# Patient Record
Sex: Male | Born: 1980 | Race: Black or African American | Hispanic: No | Marital: Single | Smoking: Former smoker
Health system: Southern US, Community
[De-identification: ages and names within clinical notes are randomized; demographics above are authoritative.]

## PROBLEM LIST (undated history)

## (undated) DIAGNOSIS — IMO0002 Reserved for concepts with insufficient information to code with codable children: Secondary | ICD-10-CM

## (undated) DIAGNOSIS — I10 Essential (primary) hypertension: Secondary | ICD-10-CM

## (undated) DIAGNOSIS — K219 Gastro-esophageal reflux disease without esophagitis: Secondary | ICD-10-CM

## (undated) HISTORY — PX: TONSILLECTOMY: SUR1361

---

## 2013-09-15 ENCOUNTER — Encounter (HOSPITAL_COMMUNITY): Payer: Self-pay | Admitting: Emergency Medicine

## 2013-09-15 DIAGNOSIS — I10 Essential (primary) hypertension: Secondary | ICD-10-CM | POA: Insufficient documentation

## 2013-09-15 DIAGNOSIS — Z87891 Personal history of nicotine dependence: Secondary | ICD-10-CM | POA: Insufficient documentation

## 2013-09-15 DIAGNOSIS — Z880 Allergy status to penicillin: Secondary | ICD-10-CM | POA: Insufficient documentation

## 2013-09-15 DIAGNOSIS — R369 Urethral discharge, unspecified: Secondary | ICD-10-CM | POA: Insufficient documentation

## 2013-09-15 DIAGNOSIS — N2889 Other specified disorders of kidney and ureter: Secondary | ICD-10-CM | POA: Insufficient documentation

## 2013-09-15 NOTE — ED Notes (Addendum)
Pt reports "entire head" HA x 2 years that is progressively worsening. Reports intermittent dizziness. Pt ambulatory to triage. Denies blurred vision/double vision. PERRLA. Neuro intact. PT also would like STD check.

## 2013-09-16 ENCOUNTER — Emergency Department (HOSPITAL_COMMUNITY)
Admission: EM | Admit: 2013-09-16 | Discharge: 2013-09-16 | Disposition: A | Discharge status: Home / Self Care | Payer: Medicaid - Out of State | Attending: Emergency Medicine | Admitting: Emergency Medicine

## 2013-09-16 DIAGNOSIS — R51 Headache: Secondary | ICD-10-CM

## 2013-09-16 DIAGNOSIS — N342 Other urethritis: Secondary | ICD-10-CM

## 2013-09-16 DIAGNOSIS — R519 Headache, unspecified: Secondary | ICD-10-CM

## 2013-09-16 HISTORY — DX: Essential (primary) hypertension: I10

## 2013-09-16 MED ORDER — CEFTRIAXONE SODIUM 250 MG IJ SOLR
250.0000 mg | Freq: Once | INTRAMUSCULAR | Status: AC
  Administered 2013-09-16: 250 mg via INTRAMUSCULAR
  Filled 2013-09-16: qty 250

## 2013-09-16 MED ORDER — IBUPROFEN 400 MG PO TABS
600.0000 mg | ORAL_TABLET | Freq: Once | ORAL | Status: DC
  Filled 2013-09-16 (×2): qty 1

## 2013-09-16 MED ORDER — IBUPROFEN 600 MG PO TABS: 600.0000 mg | ORAL_TABLET | Freq: Four times a day (QID) | ORAL | Status: DC | PRN

## 2013-09-16 MED ORDER — LIDOCAINE HCL (PF) 1 % IJ SOLN
INTRAMUSCULAR | Status: AC
  Administered 2013-09-16: 2.1 mL
  Filled 2013-09-16: qty 5

## 2013-09-16 MED ORDER — DOXYCYCLINE HYCLATE 100 MG PO CAPS: 100.0000 mg | ORAL_CAPSULE | Freq: Two times a day (BID) | ORAL | Status: DC

## 2013-09-16 NOTE — ED Provider Notes (Signed)
CSN: [id number]     Arrival date & time 09/15/13  2046 History   First MD Initiated Contact with Patient 09/16/13 0102     Chief Complaint  Patient presents with  . Headache     (Consider location/radiation/quality/duration/timing/severity/associated sxs/prior Treatment) HPI Patient presents with 2 years of bandlike headache associated with muscle tension in the upper part of the neck. He has no focal weakness or numbness with this. He has no photophobia or visual disturbance with this. He states there are multiple things that set off these headaches. Since moving into the area he has yet to see a neurologist about these headaches. He does state he has a CT head in the past which was normal. He is asking for an MRI. These headaches are unchanged. He has no fevers or chills. He has no neck stiffness. He has no nausea or vomiting.  Patient also is asking treatment for penile discharge and suspected STD. He's had this for several days after having unprotected sex with a male. He denies any pain associated with this. He denies any testicular pain. Past Medical History  Diagnosis Date  . Hypertension    Past Surgical History  Procedure Laterality Date  . Tonsillectomy     No family history on file. History  Substance Use Topics  . Smoking status: Former Smoker    Quit date: 09/16/2011  . Smokeless tobacco: Not on file  . Alcohol Use: No    Review of Systems  Constitutional: Negative for fever and chills.  Eyes: Negative for photophobia and visual disturbance.  Respiratory: Negative for shortness of breath.   Cardiovascular: Negative for chest pain.  Gastrointestinal: Negative for nausea, vomiting, abdominal pain and diarrhea.  Genitourinary: Positive for discharge. Negative for dysuria, flank pain, penile swelling, scrotal swelling, penile pain and testicular pain.  Musculoskeletal: Negative for back pain, neck pain and neck stiffness.  Skin: Negative for rash and wound.   Neurological: Positive for headaches. Negative for dizziness, syncope, weakness and numbness.  All other systems reviewed and are negative.     Allergies  Penicillins  Home Medications   Prior to Admission medications   Medication Sig Start Date End Date Taking? Authorizing Provider  doxycycline (VIBRAMYCIN) 100 MG capsule Take 1 capsule (100 mg total) by mouth 2 (two) times daily. One po bid x 7 days 09/16/13   Loren Raceravid Emilo Gras, MD  ibuprofen (ADVIL,MOTRIN) 600 MG tablet Take 1 tablet (600 mg total) by mouth every 6 (six) hours as needed. 09/16/13   Loren Raceravid Vasiliy Mccarry, MD   BP 131/83  Pulse 77  Temp(Src) 97.9 F (36.6 C) (Oral)  Resp 19  Ht 6' (1.829 m)  SpO2 100% Physical Exam  Nursing note and vitals reviewed. Constitutional: He is oriented to person, place, and time. He appears well-developed and well-nourished. No distress.  HENT:  Head: Normocephalic and atraumatic.  Mouth/Throat: Oropharynx is clear and moist. No oropharyngeal exudate.  No sinus tenderness to percussion. Patient does have mild tenderness with palpation over the temporalis and occipitalis muscles  Eyes: EOM are normal. Pupils are equal, round, and reactive to light.  Neck: Normal range of motion. Neck supple.  No nuchal rigidity or posterior midline cervical tenderness.  Cardiovascular: Normal rate and regular rhythm.  Exam reveals no gallop and no friction rub.   No murmur heard. Pulmonary/Chest: Effort normal and breath sounds normal. No respiratory distress. He has no wheezes. He has no rales. He exhibits no tenderness.  Abdominal: Soft. Bowel sounds are normal. He exhibits  no distension and no mass. There is no tenderness. There is no rebound and no guarding.  Genitourinary:  Purulent penile discharge. No testicular masses or pain.  Musculoskeletal: Normal range of motion. He exhibits no edema and no tenderness.  Neurological: He is alert and oriented to person, place, and time.  Patient is alert and  oriented x3 with clear, goal oriented speech. Patient has 5/5 motor in all extremities. Sensation is intact to light touch. Bilateral finger-to-nose is normal with no signs of dysmetria. Patient has a normal gait and walks without assistance.   Skin: Skin is warm and dry. No rash noted. No erythema.  Psychiatric: He has a normal mood and affect. His behavior is normal.    ED Course  Procedures (including critical care time) Labs Review Labs Reviewed - No data to display  Imaging Review No results found.   EKG Interpretation None      MDM   Final diagnoses:  Urethritis  Chronic headaches    Patient treated for urethritis with IM injection and will be sent home with course of doxycycline. He's been advised to have all sexual partners treated. Regarding the patient's headache he is advised to followup with a neurologist. He's been provided resources. I do not believe that an emergent MRI imaging is necessary given the chronic nature of the headache. He has a normal neurologic exam. Return precautions have been given.   Loren Raceravid Keir Foland, MD 09/16/13 [phone]

## 2013-09-16 NOTE — Discharge Instructions (Signed)
Take medication as prescribed. Followup with a neurologist for persistent headaches. Have all sexual partners seen by a medical professional and treated. Use safe sex practices.  Tension Headache A tension headache is a feeling of pain, pressure, or aching often felt over the front and sides of the head. The pain can be dull or can feel tight (constricting). It is the most common type of headache. Tension headaches are not normally associated with nausea or vomiting and do not get worse with physical activity. Tension headaches can last 30 minutes to several days.  CAUSES  The exact cause is not known, but it may be caused by chemicals and hormones in the brain that lead to pain. Tension headaches often begin after stress, anxiety, or depression. Other triggers may include:  Alcohol.  Caffeine (too much or withdrawal).  Respiratory infections (colds, flu, sinus infections).  Dental problems or teeth clenching.  Fatigue.  Holding your head and neck in one position too long while using a computer. SYMPTOMS   Pressure around the head.   Dull, aching head pain.   Pain felt over the front and sides of the head.   Tenderness in the muscles of the head, neck, and shoulders. DIAGNOSIS  A tension headache is often diagnosed based on:   Symptoms.   Physical examination.   A CT scan or MRI of your head. These tests may be ordered if symptoms are severe or unusual. TREATMENT  Medicines may be given to help relieve symptoms.  HOME CARE INSTRUCTIONS   Only take over-the-counter or prescription medicines for pain or discomfort as directed by your caregiver.   Lie down in a dark, quiet room when you have a headache.   Keep a journal to find out what may be triggering your headaches. For example, write down:  What you eat and drink.  How much sleep you get.  Any change to your diet or medicines.  Try massage or other relaxation techniques.   Ice packs or heat applied to  the head and neck can be used. Use these 3 to 4 times per day for 15 to 20 minutes each time, or as needed.   Limit stress.   Sit up straight, and do not tense your muscles.   Quit smoking if you smoke.  Limit alcohol use.  Decrease the amount of caffeine you drink, or stop drinking caffeine.  Eat and exercise regularly.  Get 7 to 9 hours of sleep, or as recommended by your caregiver.  Avoid excessive use of pain medicine as recurrent headaches can occur.  SEEK MEDICAL CARE IF:   You have problems with the medicines you were prescribed.  Your medicines do not work.  You have a change from the usual headache.  You have nausea or vomiting. SEEK IMMEDIATE MEDICAL CARE IF:   Your headache becomes severe.  You have a fever.  You have a stiff neck.  You have loss of vision.  You have muscular weakness or loss of muscle control.  You lose your balance or have trouble walking.  You feel faint or pass out.  You have severe symptoms that are different from your first symptoms. MAKE SURE YOU:   Understand these instructions.  Will watch your condition.  Will get help right away if you are not doing well or get worse. Document Released: 05/08/2005 Document Revised: 07/31/2011 Document Reviewed: 04/28/2011 [employer] Patient Information 2014 [place], [place]. Sexually Transmitted Disease A sexually transmitted disease (STD) is a disease or infection that may  be passed (transmitted) from person to person, usually during sexual activity. This may happen by way of saliva, semen, blood, vaginal mucus, or urine. Common STDs include:   Gonorrhea.   Chlamydia.   Syphilis.   HIV and AIDS.   Genital herpes.   Hepatitis B and C.   Trichomonas.   Human papillomavirus (HPV).   Pubic lice.   Scabies.  Mites.  Bacterial vaginosis. WHAT ARE CAUSES OF STDs? An STD may be caused by bacteria, a virus, or parasites. STDs are often transmitted during sexual  activity if one person is infected. However, they may also be transmitted through nonsexual means. STDs may be transmitted after:   Sexual intercourse with an infected person.   Sharing sex toys with an infected person.   Sharing needles with an infected person or using unclean piercing or tattoo needles.  Having intimate contact with the genitals, mouth, or rectal areas of an infected person.   Exposure to infected fluids during birth. WHAT ARE THE SIGNS AND SYMPTOMS OF STDs? Different STDs have different symptoms. Some people may not have any symptoms. If symptoms are present, they may include:   Painful or bloody urination.   Pain in the pelvis, abdomen, vagina, anus, throat, or eyes.   Skin rash, itching, irritation, growths, sores (lesions), ulcerations, or warts in the genital or anal area.  Abnormal vaginal discharge with or without bad odor.   Penile discharge in men.   Fever.   Pain or bleeding during sexual intercourse.   Swollen glands in the groin area.   Yellow skin and eyes (jaundice). This is seen with hepatitis.   Swollen testicles.  Infertility.  Sores and blisters in the mouth. HOW ARE STDs DIAGNOSED? To make a diagnosis, your health care provider may:   Take a medical history.   Perform a physical exam.   Take a sample of any discharge for examination.  Swab the throat, cervix, opening to the penis, rectum, or vagina for testing.  Test a sample of your first morning urine.   Perform blood tests.   Perform a Pap smear, if this applies.   Perform a colposcopy.   Perform a laparoscopy.  HOW ARE STDs TREATED? Treatment depends on the STD. Some STDs may be treated but not cured.   Chlamydia, gonorrhea, trichomonas, and syphilis can be cured with antibiotics.   Genital herpes, hepatitis, and HIV can be treated, but not cured, with prescribed medicines. The medicines lessen symptoms.   Genital warts from HPV can be  treated with medicine or by freezing, burning (electrocautery), or surgery. Warts may come back.   HPV cannot be cured with medicine or surgery. However, abnormal areas may be removed from the cervix, vagina, or vulva.   If your diagnosis is confirmed, your recent sexual partners need treatment. This is true even if they are symptom-free or have a negative culture or evaluation. They should not have sex until their health care providers say it is OK. HOW CAN I REDUCE MY RISK OF GETTING AN STD?  Use latex condoms, dental dams, and water-soluble lubricants during sexual activity. Do not use petroleum jelly or oils.  Get vaccinated for HPV and hepatitis. If you have not received these vaccines in the past, talk to your health care provider about whether one or both might be right for you.   Avoid risky sex practices that can break the skin.  WHAT SHOULD I DO IF I THINK I HAVE AN STD?  See your health care  provider.   Inform all sexual partners. They should be tested and treated for any STDs.  Do not have sex until your health care provider says it is OK. WHEN SHOULD I GET HELP? Seek immediate medical care if:  You develop severe abdominal pain.  You are a man and notice swelling or pain in the testicles.  You are a woman and notice swelling or pain in your vagina. Document Released: 07/29/2002 Document Revised: 02/26/2013 Document Reviewed: 11/26/2012 [facility] Patient Information 2014 [place]Care, [place]LLC.

## 2013-10-16 ENCOUNTER — Encounter: Payer: Self-pay | Admitting: *Deleted

## 2013-10-21 ENCOUNTER — Ambulatory Visit: Payer: PRIVATE HEALTH INSURANCE | Admitting: Neurology

## 2013-10-22 ENCOUNTER — Encounter: Payer: Self-pay | Admitting: Neurology

## 2013-10-22 ENCOUNTER — Telehealth: Payer: Self-pay | Admitting: Neurology

## 2013-10-22 NOTE — Telephone Encounter (Signed)
Pt no showed NP appt w/ Dr. Shon Millet. Pt was ER referral. No show letter mailed to patient / Oneita Kras.

## 2014-03-02 ENCOUNTER — Emergency Department (HOSPITAL_COMMUNITY)
Admission: EM | Admit: 2014-03-02 | Discharge: 2014-03-02 | Disposition: A | Discharge status: Home / Self Care | Payer: PRIVATE HEALTH INSURANCE | Attending: Emergency Medicine | Admitting: Emergency Medicine

## 2014-03-02 ENCOUNTER — Encounter (HOSPITAL_COMMUNITY): Payer: Self-pay | Admitting: Emergency Medicine

## 2014-03-02 DIAGNOSIS — I10 Essential (primary) hypertension: Secondary | ICD-10-CM | POA: Insufficient documentation

## 2014-03-02 DIAGNOSIS — Z87891 Personal history of nicotine dependence: Secondary | ICD-10-CM | POA: Insufficient documentation

## 2014-03-02 DIAGNOSIS — Z880 Allergy status to penicillin: Secondary | ICD-10-CM | POA: Insufficient documentation

## 2014-03-02 DIAGNOSIS — Z202 Contact with and (suspected) exposure to infections with a predominantly sexual mode of transmission: Secondary | ICD-10-CM

## 2014-03-02 LAB — RPR

## 2014-03-02 LAB — HIV ANTIBODY (ROUTINE TESTING W REFLEX): HIV 1&2 Ab, 4th Generation: NONREACTIVE

## 2014-03-02 NOTE — Discharge Instructions (Signed)
You'll be contacted if any of your culture reports her blood testing is positive. Return here if you develop penile drainage or discharge.

## 2014-03-02 NOTE — ED Provider Notes (Signed)
CSN: [id number]     Arrival date & time 03/02/14  0017 History   First MD Initiated Contact with Patient 03/02/14 0051     Chief Complaint  Patient presents with  . SEXUALLY TRANSMITTED DISEASE     (Consider location/radiation/quality/duration/timing/severity/associated sxs/prior Treatment) HPI Comments: Patient here complaining of possible exposure to STD. Girlfriend told him that here to be evaluated. She did not elaborate if you had an STD. He denies any symptoms at this time. No penile drainage or discharge. Denies any genital rashes. No fever or chills.  The history is provided by the patient.    Past Medical History  Diagnosis Date  . Hypertension    Past Surgical History  Procedure Laterality Date  . Tonsillectomy     No family history on file. History  Substance Use Topics  . Smoking status: Former Smoker    Quit date: 09/16/2011  . Smokeless tobacco: Not on file  . Alcohol Use: No    Review of Systems  All other systems reviewed and are negative.     Allergies  Penicillins  Home Medications   Prior to Admission medications   Not on File   BP 155/86  Pulse 59  Temp(Src) 98.9 F (37.2 C) (Oral)  Resp 20  Wt 244 lb (110.678 kg)  SpO2 98% Physical Exam  Nursing note and vitals reviewed. Constitutional: He is oriented to person, place, and time. He appears well-developed and well-nourished.  Non-toxic appearance. No distress.  HENT:  Head: Normocephalic and atraumatic.  Eyes: Conjunctivae, EOM and lids are normal. Pupils are equal, round, and reactive to light.  Neck: Normal range of motion. Neck supple. No tracheal deviation present. No mass present.  Cardiovascular: Normal rate, regular rhythm and normal heart sounds.  Exam reveals no gallop.   No murmur heard. Pulmonary/Chest: Effort normal and breath sounds normal. No stridor. No respiratory distress. He has no decreased breath sounds. He has no wheezes. He has no rhonchi. He has no rales.   Abdominal: Soft. Normal appearance and bowel sounds are normal. He exhibits no distension. There is no tenderness. There is no rebound and no CVA tenderness.  Musculoskeletal: Normal range of motion. He exhibits no edema and no tenderness.  Neurological: He is alert and oriented to person, place, and time. He has normal strength. No cranial nerve deficit or sensory deficit. GCS eye subscore is 4. GCS verbal subscore is 5. GCS motor subscore is 6.  Skin: Skin is warm and dry. No abrasion and no rash noted.  Psychiatric: He has a normal mood and affect. His speech is normal and behavior is normal.    ED Course  Procedures (including critical care time) Labs Review Labs Reviewed  GC/CHLAMYDIA PROBE AMP  HIV ANTIBODY (ROUTINE TESTING)  RPR    Imaging Review No results found.   EKG Interpretation None      MDM   Final diagnoses:  None    Genital culture obtained and RPR and HIV testing done. Patient will be contacted results are positive    Toy BakerAnthony T Charnita Trudel, MD 03/02/14 0104
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
## 2014-03-02 NOTE — ED Notes (Signed)
Patient is alert and oriented x3.  He was given DC instructions and follow up visit instructions.  Patient gave verbal understanding.  He was DC ambulatory under his own power to home.  V/S stable.  He was not showing any signs of distress on DC 

## 2014-03-03 LAB — GC/CHLAMYDIA PROBE AMP
CT Probe RNA: NEGATIVE
GC Probe RNA: NEGATIVE

## 2016-02-28 ENCOUNTER — Emergency Department (HOSPITAL_COMMUNITY)
Admission: EM | Admit: 2016-02-28 | Discharge: 2016-02-28 | Disposition: A | Discharge status: Home / Self Care | Payer: No Typology Code available for payment source | Attending: Emergency Medicine | Admitting: Emergency Medicine

## 2016-02-28 ENCOUNTER — Encounter (HOSPITAL_COMMUNITY): Payer: Self-pay | Admitting: Emergency Medicine

## 2016-02-28 DIAGNOSIS — Y939 Activity, unspecified: Secondary | ICD-10-CM | POA: Diagnosis not present

## 2016-02-28 DIAGNOSIS — Y92410 Unspecified street and highway as the place of occurrence of the external cause: Secondary | ICD-10-CM | POA: Insufficient documentation

## 2016-02-28 DIAGNOSIS — Y999 Unspecified external cause status: Secondary | ICD-10-CM | POA: Diagnosis not present

## 2016-02-28 DIAGNOSIS — Z87891 Personal history of nicotine dependence: Secondary | ICD-10-CM | POA: Insufficient documentation

## 2016-02-28 DIAGNOSIS — I10 Essential (primary) hypertension: Secondary | ICD-10-CM | POA: Insufficient documentation

## 2016-02-28 DIAGNOSIS — S199XXA Unspecified injury of neck, initial encounter: Secondary | ICD-10-CM | POA: Diagnosis present

## 2016-02-28 DIAGNOSIS — S161XXA Strain of muscle, fascia and tendon at neck level, initial encounter: Secondary | ICD-10-CM

## 2016-02-28 HISTORY — DX: Reserved for concepts with insufficient information to code with codable children: IMO0002

## 2016-02-28 HISTORY — DX: Gastro-esophageal reflux disease without esophagitis: K21.9

## 2016-02-28 MED ORDER — NAPROXEN 500 MG PO TABS: 500.0000 mg | ORAL_TABLET | Freq: Two times a day (BID) | ORAL | 0 refills | Status: DC

## 2016-02-28 NOTE — Discharge Instructions (Signed)
Apply ice to affected area. Take naprosyn as needed for pain. Increase your mobility. Follow up with your primary care provider if your symptoms do not improve. Return to the ED if you experience severe worsening of your symptoms, loss of consciousness, blurry vision, vomiting, loss of control of your bowels or bladder, inability to walk, numbness or tingling in your lower extremities.
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
## 2016-04-04 ENCOUNTER — Encounter (HOSPITAL_COMMUNITY): Payer: Self-pay | Admitting: Emergency Medicine

## 2016-04-04 ENCOUNTER — Emergency Department (HOSPITAL_COMMUNITY)
Admission: EM | Admit: 2016-04-04 | Discharge: 2016-04-04 | Disposition: A | Discharge status: Home / Self Care | Payer: PRIVATE HEALTH INSURANCE | Attending: Emergency Medicine | Admitting: Emergency Medicine

## 2016-04-04 DIAGNOSIS — R369 Urethral discharge, unspecified: Secondary | ICD-10-CM

## 2016-04-04 DIAGNOSIS — I10 Essential (primary) hypertension: Secondary | ICD-10-CM | POA: Insufficient documentation

## 2016-04-04 DIAGNOSIS — N342 Other urethritis: Secondary | ICD-10-CM | POA: Insufficient documentation

## 2016-04-04 DIAGNOSIS — Z87891 Personal history of nicotine dependence: Secondary | ICD-10-CM | POA: Insufficient documentation

## 2016-04-04 LAB — URINE MICROSCOPIC-ADD ON: RBC / HPF: NONE SEEN RBC/hpf (ref 0–5)

## 2016-04-04 LAB — URINALYSIS, ROUTINE W REFLEX MICROSCOPIC
BILIRUBIN URINE: NEGATIVE
Glucose, UA: NEGATIVE mg/dL
Hgb urine dipstick: NEGATIVE
Ketones, ur: NEGATIVE mg/dL
Nitrite: NEGATIVE
PH: 7.5 (ref 5.0–8.0)
Protein, ur: NEGATIVE mg/dL
SPECIFIC GRAVITY, URINE: 1.026 (ref 1.005–1.030)

## 2016-04-04 LAB — GC/CHLAMYDIA PROBE AMP (~~LOC~~) NOT AT ARMC
Chlamydia: NEGATIVE
NEISSERIA GONORRHEA: POSITIVE — AB

## 2016-04-04 MED ORDER — METRONIDAZOLE 500 MG PO TABS
2000.0000 mg | ORAL_TABLET | Freq: Once | ORAL | Status: AC
  Administered 2016-04-04: 2000 mg via ORAL
  Filled 2016-04-04: qty 4

## 2016-04-04 MED ORDER — AZITHROMYCIN 250 MG PO TABS
1000.0000 mg | ORAL_TABLET | Freq: Once | ORAL | Status: AC
  Administered 2016-04-04: 1000 mg via ORAL
  Filled 2016-04-04: qty 4

## 2016-04-04 MED ORDER — LIDOCAINE HCL (PF) 1 % IJ SOLN
INTRAMUSCULAR | Status: AC
  Administered 2016-04-04: 0.9 mL
  Filled 2016-04-04: qty 5

## 2016-04-04 MED ORDER — CEFTRIAXONE SODIUM 250 MG IJ SOLR
250.0000 mg | Freq: Once | INTRAMUSCULAR | Status: AC
  Administered 2016-04-04: 250 mg via INTRAMUSCULAR
  Filled 2016-04-04: qty 250

## 2016-04-04 NOTE — ED Notes (Signed)
Pt departed in NAD.  

## 2016-04-04 NOTE — ED Provider Notes (Signed)
[facility]-EMERGENCY DEPT Provider Note   CSN: [id number] Arrival date & time: 04/04/16  0209  By signing my name below, I, Arianna Nassar, attest that this documentation has been prepared under the direction and in the presence of Gilda Creasehristopher J Andreana Klingerman, MD.  Electronically Signed: Octavia HeirArianna Nassar, ED Scribe. 04/04/16. 3:32 AM.    History   Chief Complaint Chief Complaint  Patient presents with  . Penile Discharge    The history is provided by the patient. No language interpreter was used.   HPI Comments: Eric Kent is a 35 y.o. male who has a PMhx of HTN presents to the Emergency Department complaining of sudden onset, moderate, yellow penile discharge x 2 days. He notes associated dysuria. Pt has been having unprotected sex with the same partner for ~ 5 months. Pt believes his partner may have a yeast infection. There are no other complaints. Denies penile swelling or penile lesions.   Past Medical History:  Diagnosis Date  . GERD (gastroesophageal reflux disease)   . Hypertension   . Ulcer (HCC)     There are no active problems to display for this patient.   Past Surgical History:  Procedure Laterality Date  . TONSILLECTOMY         Home Medications    Prior to Admission medications   Medication Sig Start Date End Date Taking? Authorizing Provider  naproxen (NAPROSYN) 500 MG tablet Take 1 tablet (500 mg total) by mouth 2 (two) times daily. 02/28/16   Samantha Tripp Dowless, PA-C    Family History No family history on file.  Social History Social History  Substance Use Topics  . Smoking status: Former Smoker    Quit date: 09/16/2011  . Smokeless tobacco: Never Used  . Alcohol use No     Allergies   Penicillins   Review of Systems Review of Systems  Genitourinary: Positive for discharge and dysuria. Negative for penile pain and penile swelling.  All other systems reviewed and are negative.    Physical Exam Updated Vital Signs BP 133/86 (BP  Location: Left Arm)   Pulse 93   Temp 98.5 F (36.9 C) (Oral)   Resp 16   Ht 6' (1.829 m)   Wt 254 lb (115.2 kg)   SpO2 99%   BMI 34.45 kg/m   Physical Exam  Constitutional: He is oriented to person, place, and time. He appears well-developed and well-nourished. No distress.  HENT:  Head: Normocephalic and atraumatic.  Right Ear: Hearing normal.  Left Ear: Hearing normal.  Nose: Nose normal.  Mouth/Throat: Oropharynx is clear and moist and mucous membranes are normal.  Eyes: Conjunctivae and EOM are normal. Pupils are equal, round, and reactive to light.  Neck: Normal range of motion. Neck supple.  Cardiovascular: Regular rhythm, S1 normal and S2 normal.  Exam reveals no gallop and no friction rub.   No murmur heard. Pulmonary/Chest: Effort normal and breath sounds normal. No respiratory distress. He exhibits no tenderness.  Abdominal: Soft. Normal appearance and bowel sounds are normal. There is no hepatosplenomegaly. There is no tenderness. There is no rebound, no guarding, no tenderness at McBurney's point and negative Murphy's sign. No hernia.  Genitourinary:  Genitourinary Comments: Circumcised, scrotum normal   Musculoskeletal: Normal range of motion.  Neurological: He is alert and oriented to person, place, and time. He has normal strength. No cranial nerve deficit or sensory deficit. Coordination normal. GCS eye subscore is 4. GCS verbal subscore is 5. GCS motor subscore is 6.  Skin: Skin is  warm, dry and intact. No rash noted. No cyanosis.  Psychiatric: He has a normal mood and affect. His speech is normal and behavior is normal. Thought content normal.  Nursing note and vitals reviewed.    ED Treatments / Results  DIAGNOSTIC STUDIES: Oxygen Saturation is 99% on RA, normal by my interpretation.  COORDINATION OF CARE:  3:29 AM Discussed treatment plan with pt at bedside and pt agreed to plan.  Labs (all labs ordered are listed, but only abnormal results are  displayed) Labs Reviewed  URINALYSIS, ROUTINE W REFLEX MICROSCOPIC (NOT AT [facility])  GC/CHLAMYDIA PROBE AMP ([facility]) NOT AT [facility]    EKG  EKG Interpretation None       Radiology No results found.  Procedures Procedures (including critical care time)  Medications Ordered in ED Medications  cefTRIAXone (ROCEPHIN) injection 250 mg (not administered)  azithromycin (ZITHROMAX) tablet 1,000 mg (not administered)  metroNIDAZOLE (FLAGYL) tablet 2,000 mg (not administered)     Initial Impression / Assessment and Plan / ED Course  I have reviewed the triage vital signs and the nursing notes.  Pertinent labs & imaging results that were available during my care of the patient were reviewed by me and considered in my medical decision making (see chart for details).  Clinical Course    Patient presents with complaints of urethral discharge and dysuria. Patient does admit to unprotected sex. Physical examination was unremarkable. GC, chlamydia, RPR, HIV pending. Treat empirically.  I personally performed the services described in this documentation, which was scribed in my presence. The recorded information has been reviewed and is accurate.  Final Clinical Impressions(s) / ED Diagnoses   Final diagnoses:  Penile discharge  Urethritis    New Prescriptions New Prescriptions   No medications on file     Gilda Creasehristopher J Lamees Gable, MD 04/04/16 [phone]

## 2016-04-04 NOTE — ED Triage Notes (Signed)
Pt. reports yellowish penile discharge with dysuria onset last week , denies fever or chills .

## 2016-04-04 NOTE — ED Notes (Addendum)
ED Provider at bedside. 

## 2016-04-05 ENCOUNTER — Encounter (HOSPITAL_COMMUNITY): Payer: Self-pay | Admitting: Emergency Medicine

## 2016-06-02 ENCOUNTER — Encounter (HOSPITAL_COMMUNITY): Payer: Self-pay | Admitting: Emergency Medicine

## 2016-06-02 ENCOUNTER — Emergency Department (HOSPITAL_COMMUNITY)
Admission: EM | Admit: 2016-06-02 | Discharge: 2016-06-02 | Disposition: A | Discharge status: Home / Self Care | Payer: PRIVATE HEALTH INSURANCE | Attending: Emergency Medicine | Admitting: Emergency Medicine

## 2016-06-02 DIAGNOSIS — J029 Acute pharyngitis, unspecified: Secondary | ICD-10-CM | POA: Insufficient documentation

## 2016-06-02 DIAGNOSIS — Z79899 Other long term (current) drug therapy: Secondary | ICD-10-CM | POA: Insufficient documentation

## 2016-06-02 DIAGNOSIS — B356 Tinea cruris: Secondary | ICD-10-CM | POA: Insufficient documentation

## 2016-06-02 DIAGNOSIS — Z87891 Personal history of nicotine dependence: Secondary | ICD-10-CM | POA: Insufficient documentation

## 2016-06-02 DIAGNOSIS — I10 Essential (primary) hypertension: Secondary | ICD-10-CM | POA: Insufficient documentation

## 2016-06-02 DIAGNOSIS — R05 Cough: Secondary | ICD-10-CM | POA: Insufficient documentation

## 2016-06-02 DIAGNOSIS — R6889 Other general symptoms and signs: Secondary | ICD-10-CM

## 2016-06-02 MED ORDER — MICONAZOLE NITRATE 2 % EX CREA: 1.0000 "application " | TOPICAL_CREAM | Freq: Two times a day (BID) | CUTANEOUS | 0 refills | Status: DC

## 2016-06-02 NOTE — ED Provider Notes (Signed)
Eric BurlyMichael Kent is a 36 y.o. male, with a history of HTN, presenting to the ED with URI symptoms, but also complains of genital itching.    Physical Exam  BP (!) 118/52 (BP Location: Right Arm)   Pulse 80   Temp 98.7 F (37.1 C) (Oral)   Resp 18   SpO2 99%   Physical Exam  Constitutional: He appears well-developed and well-nourished. No distress.  HENT:  Head: Normocephalic and atraumatic.  Eyes: Conjunctivae are normal.  Neck: Neck supple.  Cardiovascular: Normal rate and regular rhythm.   Pulmonary/Chest: Effort normal.  Genitourinary:  Genitourinary Comments: Evidence of tinea cruris infection to left scrotum and left inguinal crease. Penis, scrotum, and testicles without swelling, lesions, or tenderness. No penile discharge.  No inguinal hernia noted. Cremasteric reflex intact. Otherwise normal male genitalia. RN served as Biomedical engineerchaperone during the exam.  Neurological: He is alert.  Skin: Skin is warm and dry. He is not diaphoretic.  Psychiatric: He has a normal mood and affect. His behavior is normal.  Nursing note and vitals reviewed.   ED Course  Procedures  MDM   Received handoff report from Mathews RobinsonsJessica Mitchell, PA-C. Patient is to receive a genital exam and have appropriate treatment prescribed. She states he has been handling his URI symptoms well and can be instructed to maintain in his current course.  Tinea curious on exam. Appropriate treatment prescribed and discuss.   Vitals:   06/02/16 1835 06/02/16 2145  BP: (!) 118/52 131/88  Pulse: 80 63  Resp: 18 16  Temp: 98.7 F (37.1 C)   TempSrc: Oral   SpO2: 99% 96%        Anselm PancoastShawn C Sheilyn Boehlke, PA-C 06/03/16 0155    Derwood KaplanAnkit Nanavati, MD 06/05/16 1631

## 2016-06-02 NOTE — Discharge Instructions (Signed)
Continue with your previously prescribed treatment for your flu-like symptoms.   For the genital infection, jock itch is the likely cause. Use the prescribed cream twice daily. Continue apply the cream for 2 days after the rash resolves to avoid repeat infection.

## 2016-06-02 NOTE — ED Triage Notes (Signed)
Pt complaint of cough, sore throat, and chills for 4 days unrelieved by OTC medications.

## 2016-06-02 NOTE — ED Provider Notes (Signed)
[facility]-EMERGENCY DEPT Provider Note   CSN: [id number] Arrival date & time: 06/02/16  1822     History   Chief Complaint Chief Complaint  Patient presents with  . Flu Like Symptoms    HPI Eric Kent is a 36 y.o. male presenting with 4 days of runny nose, sore throat, cough, myalgias and fatigue. Patient states that he has tried DayQuil has been sleeping in the first couple of days and has been doing better overall. He is drinking and eating well. She denies chest pain, shortness breath, productive cough, nausea, vomiting, reports subjective fevers but no chills. He denies abdominal pain, diarrhea, dysuria, hematuria. He came to the emergency department because he wanted to get something that would stop the cold faster. He also states that he's been having a jock itch. He has tried antifungal creams over-the-counter and cortisone with mild relief.   HPI  Past Medical History:  Diagnosis Date  . GERD (gastroesophageal reflux disease)   . Hypertension   . Ulcer (HCC)     There are no active problems to display for this patient.   Past Surgical History:  Procedure Laterality Date  . TONSILLECTOMY         Home Medications    Prior to Admission medications   Medication Sig Start Date End Date Taking? Authorizing Provider  naproxen (NAPROSYN) 500 MG tablet Take 1 tablet (500 mg total) by mouth 2 (two) times daily. 02/28/16   Samantha Tripp Dowless, PA-C    Family History No family history on file.  Social History Social History  Substance Use Topics  . Smoking status: Former Smoker    Quit date: 09/16/2011  . Smokeless tobacco: Never Used  . Alcohol use No     Allergies   Penicillins   Review of Systems Review of Systems  Constitutional: Negative for chills and fever.  HENT: Positive for congestion, postnasal drip, rhinorrhea and sore throat. Negative for ear pain and trouble swallowing.   Eyes: Negative for pain and visual disturbance.  Respiratory:  Positive for cough. Negative for choking, chest tightness, shortness of breath, wheezing and stridor.   Cardiovascular: Negative for chest pain, palpitations and leg swelling.  Gastrointestinal: Negative for abdominal distention, abdominal pain, diarrhea, nausea and vomiting.  Genitourinary: Negative for discharge, dysuria and hematuria.       Patient describes a rash in the groin area thought to be "jock ". Patient states that he has had this in the past but it was a while ago. He had been treating it with over-the-counter medication with some improvement.  Musculoskeletal: Positive for myalgias. Negative for arthralgias, back pain, gait problem, neck pain and neck stiffness.       Patient had myalgias 3 days ago but has resolved  Skin: Positive for rash. Negative for color change, pallor and wound.       She has rash in her groin area.  Neurological: Negative for dizziness, seizures, syncope, weakness, light-headedness, numbness and headaches.  All other systems reviewed and are negative.    Physical Exam Updated Vital Signs BP (!) 118/52 (BP Location: Right Arm)   Pulse 80   Temp 98.7 F (37.1 C) (Oral)   Resp 18   SpO2 99%   Physical Exam  Constitutional: He appears well-developed and well-nourished. No distress.  Afebrile, nontoxic appearing, sitting comfortably in chair in no acute distress.  HENT:  Head: Normocephalic and atraumatic.  Right Ear: External ear normal.  Left Ear: External ear normal.  Nose: Nose normal.  Mouth/Throat: Oropharynx is clear and moist. No oropharyngeal exudate.  Eyes: Conjunctivae and EOM are normal. Pupils are equal, round, and reactive to light. Right eye exhibits no discharge. Left eye exhibits no discharge. No scleral icterus.  Neck: Normal range of motion. Neck supple.  Cardiovascular: Normal rate, regular rhythm, normal heart sounds and intact distal pulses.   No murmur heard. Pulmonary/Chest: Effort normal and breath sounds normal. No  stridor. No respiratory distress. He has no wheezes. He has no rales. He exhibits no tenderness.  Abdominal: Soft. He exhibits no distension. There is no tenderness. There is no guarding.  Genitourinary:  Genitourinary Comments: Patient care was transferred to PA Harolyn RutherfordShawn Joy who will be performing the genital exam.  Musculoskeletal: Normal range of motion. He exhibits no edema or deformity.  Neurological: He is alert.  Skin: Skin is warm and dry. He is not diaphoretic.  Psychiatric: He has a normal mood and affect. His behavior is normal.  Nursing note and vitals reviewed.    ED Treatments / Results  Labs (all labs ordered are listed, but only abnormal results are displayed) Labs Reviewed - No data to display  EKG  EKG Interpretation None       Radiology No results found.  Procedures Procedures (including critical care time)  Medications Ordered in ED Medications - No data to display   Initial Impression / Assessment and Plan / ED Course  I have reviewed the triage vital signs and the nursing notes.  Pertinent labs & imaging results that were available during my care of the patient were reviewed by me and considered in my medical decision making (see chart for details).  Clinical Course    Otherwise healthy 36 year old male with flu-like symptoms for the past 4 days which have been improving with over-the-counter medication. Patient is afebrile nontoxic appearing. Exam is unremarkable and reassuring. Lungs are clear and equal bilaterally. Oropharynx without exudate. Patient is otherwise feeling much better and simply wanted something stronger to stop his cold faster.   Transferred patient care at end of shift to [facility] PA-C pending genital exam and anticipating discharge home with antifungal cream and symptomatic relief of symptoms. He was advised to drink plenty of fluids.  Discussed strict return precautions. Patient was advised to return to the emergency department if  experiencing any worsening of symptoms. Patient understood instructions and agreed with discharge plan.  Final Clinical Impressions(s) / ED Diagnoses   Final diagnoses:  Flu-like symptoms    New Prescriptions New Prescriptions   No medications on file     Gregary CromerJessica B Anmol Fleck, PA-C 06/04/16 1343    Lyndal Pulleyaniel Knott, MD 06/04/16 2259

## 2016-09-29 ENCOUNTER — Emergency Department (HOSPITAL_COMMUNITY): Payer: PRIVATE HEALTH INSURANCE

## 2016-09-29 ENCOUNTER — Emergency Department (HOSPITAL_COMMUNITY)
Admission: EM | Admit: 2016-09-29 | Discharge: 2016-09-29 | Disposition: A | Discharge status: Home / Self Care | Payer: PRIVATE HEALTH INSURANCE | Attending: Emergency Medicine | Admitting: Emergency Medicine

## 2016-09-29 ENCOUNTER — Encounter (HOSPITAL_COMMUNITY): Payer: Self-pay | Admitting: Emergency Medicine

## 2016-09-29 DIAGNOSIS — S61451A Open bite of right hand, initial encounter: Secondary | ICD-10-CM | POA: Insufficient documentation

## 2016-09-29 DIAGNOSIS — W503XXA Accidental bite by another person, initial encounter: Secondary | ICD-10-CM | POA: Insufficient documentation

## 2016-09-29 DIAGNOSIS — Z87891 Personal history of nicotine dependence: Secondary | ICD-10-CM | POA: Insufficient documentation

## 2016-09-29 DIAGNOSIS — Y999 Unspecified external cause status: Secondary | ICD-10-CM | POA: Insufficient documentation

## 2016-09-29 DIAGNOSIS — Y9289 Other specified places as the place of occurrence of the external cause: Secondary | ICD-10-CM | POA: Insufficient documentation

## 2016-09-29 DIAGNOSIS — Z79899 Other long term (current) drug therapy: Secondary | ICD-10-CM | POA: Insufficient documentation

## 2016-09-29 DIAGNOSIS — Y9372 Activity, wrestling: Secondary | ICD-10-CM | POA: Insufficient documentation

## 2016-09-29 DIAGNOSIS — I10 Essential (primary) hypertension: Secondary | ICD-10-CM | POA: Insufficient documentation

## 2016-09-29 LAB — CBC WITH DIFFERENTIAL/PLATELET
BASOS ABS: 0 10*3/uL (ref 0.0–0.1)
BASOS PCT: 0 %
EOS PCT: 1 %
Eosinophils Absolute: 0.1 10*3/uL (ref 0.0–0.7)
HCT: 42.9 % (ref 39.0–52.0)
Hemoglobin: 14.1 g/dL (ref 13.0–17.0)
Lymphocytes Relative: 21 %
Lymphs Abs: 1.1 10*3/uL (ref 0.7–4.0)
MCH: 28.8 pg (ref 26.0–34.0)
MCHC: 32.9 g/dL (ref 30.0–36.0)
MCV: 87.6 fL (ref 78.0–100.0)
Monocytes Absolute: 0.4 10*3/uL (ref 0.1–1.0)
Monocytes Relative: 7 %
NEUTROS ABS: 3.7 10*3/uL (ref 1.7–7.7)
Neutrophils Relative %: 71 %
Platelets: 217 10*3/uL (ref 150–400)
RBC: 4.9 MIL/uL (ref 4.22–5.81)
RDW: 12.8 % (ref 11.5–15.5)
WBC: 5.2 10*3/uL (ref 4.0–10.5)

## 2016-09-29 LAB — BASIC METABOLIC PANEL
Anion gap: 8 (ref 5–15)
BUN: 7 mg/dL (ref 6–20)
CALCIUM: 8.9 mg/dL (ref 8.9–10.3)
CO2: 23 mmol/L (ref 22–32)
Chloride: 106 mmol/L (ref 101–111)
Creatinine, Ser: 1.13 mg/dL (ref 0.61–1.24)
GFR calc Af Amer: >60 mL/min (ref >60)
GFR calc non Af Amer: >60 mL/min (ref >60)
Glucose, Bld: 158 mg/dL — ABNORMAL HIGH (ref 65–99)
POTASSIUM: 4.1 mmol/L (ref 3.5–5.1)
Sodium: 137 mmol/L (ref 135–145)

## 2016-09-29 MED ORDER — CLINDAMYCIN HCL 150 MG PO CAPS
300.0000 mg | ORAL_CAPSULE | Freq: Four times a day (QID) | ORAL | Status: DC
  Administered 2016-09-29: 300 mg via ORAL
  Filled 2016-09-29: qty 2

## 2016-09-29 MED ORDER — SULFAMETHOXAZOLE-TRIMETHOPRIM 800-160 MG PO TABS: 1.0000 | ORAL_TABLET | Freq: Two times a day (BID) | ORAL | 0 refills | Status: AC

## 2016-09-29 MED ORDER — LIDOCAINE HCL (PF) 1 % IJ SOLN
5.0000 mL | Freq: Once | INTRAMUSCULAR | Status: AC
  Administered 2016-09-29: 5 mL
  Filled 2016-09-29: qty 5

## 2016-09-29 MED ORDER — SULFAMETHOXAZOLE-TRIMETHOPRIM 800-160 MG PO TABS
1.0000 | ORAL_TABLET | Freq: Once | ORAL | Status: AC
Start: 2016-09-29 — End: 2016-09-29
  Administered 2016-09-29: 1 via ORAL
  Filled 2016-09-29: qty 1

## 2016-09-29 MED ORDER — IBUPROFEN 400 MG PO TABS
600.0000 mg | ORAL_TABLET | Freq: Four times a day (QID) | ORAL | Status: DC | PRN
  Administered 2016-09-29: 600 mg via ORAL
  Filled 2016-09-29: qty 1

## 2016-09-29 MED ORDER — CLINDAMYCIN HCL 300 MG PO CAPS: 300.0000 mg | ORAL_CAPSULE | Freq: Three times a day (TID) | ORAL | 0 refills | Status: AC

## 2016-09-29 MED ORDER — IBUPROFEN 800 MG PO TABS: 800.0000 mg | ORAL_TABLET | Freq: Three times a day (TID) | ORAL | 0 refills | Status: AC
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
## 2016-09-29 NOTE — Discharge Instructions (Signed)
I have prescribed you 2 antibiotics to take for infection in the hand.  Please come back to the ED to have the wound checked. Do not eat or drink anything after dinner on Saturday.  If you have worse redness, pain, swelling, or see pus draining from your hand, please come back to the ED right away.  If you have have fevers or chills come back to the ED right away.  I do not think your elbow pain is related to the hand.  Try taking ibuprofen for it.
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
## 2016-10-01 ENCOUNTER — Encounter (HOSPITAL_COMMUNITY): Payer: Self-pay

## 2016-10-01 ENCOUNTER — Emergency Department (HOSPITAL_COMMUNITY)
Admission: EM | Admit: 2016-10-01 | Discharge: 2016-10-01 | Disposition: A | Discharge status: Home / Self Care | Payer: PRIVATE HEALTH INSURANCE | Attending: Emergency Medicine | Admitting: Emergency Medicine

## 2016-10-01 DIAGNOSIS — Z87891 Personal history of nicotine dependence: Secondary | ICD-10-CM | POA: Insufficient documentation

## 2016-10-01 DIAGNOSIS — Z5189 Encounter for other specified aftercare: Secondary | ICD-10-CM

## 2016-10-01 DIAGNOSIS — Z4801 Encounter for change or removal of surgical wound dressing: Secondary | ICD-10-CM | POA: Insufficient documentation

## 2016-10-01 DIAGNOSIS — I10 Essential (primary) hypertension: Secondary | ICD-10-CM | POA: Insufficient documentation
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
## 2016-10-01 NOTE — Discharge Instructions (Signed)
Continue antibiotics and dressing changes Keep wound clean and dry - you can clean with soap and water Return for worsening symptoms
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
## 2017-03-01 ENCOUNTER — Emergency Department (HOSPITAL_COMMUNITY)
Admission: EM | Admit: 2017-03-01 | Discharge: 2017-03-01 | Disposition: A | Discharge status: Home / Self Care | Payer: PRIVATE HEALTH INSURANCE | Attending: Emergency Medicine | Admitting: Emergency Medicine

## 2017-03-01 ENCOUNTER — Encounter (HOSPITAL_COMMUNITY): Payer: Self-pay | Admitting: Emergency Medicine

## 2017-03-01 DIAGNOSIS — Z87891 Personal history of nicotine dependence: Secondary | ICD-10-CM | POA: Insufficient documentation

## 2017-03-01 DIAGNOSIS — Z79899 Other long term (current) drug therapy: Secondary | ICD-10-CM | POA: Insufficient documentation

## 2017-03-01 DIAGNOSIS — H11433 Conjunctival hyperemia, bilateral: Secondary | ICD-10-CM | POA: Insufficient documentation

## 2017-03-01 DIAGNOSIS — R202 Paresthesia of skin: Secondary | ICD-10-CM | POA: Insufficient documentation

## 2017-03-01 DIAGNOSIS — Z202 Contact with and (suspected) exposure to infections with a predominantly sexual mode of transmission: Secondary | ICD-10-CM | POA: Insufficient documentation

## 2017-03-01 DIAGNOSIS — I10 Essential (primary) hypertension: Secondary | ICD-10-CM | POA: Insufficient documentation

## 2017-03-01 DIAGNOSIS — M79605 Pain in left leg: Secondary | ICD-10-CM | POA: Insufficient documentation

## 2017-03-01 NOTE — ED Triage Notes (Signed)
Pt complaint of left calf swelling/pain onset 2 days ago; denies injury.

## 2017-03-01 NOTE — ED Provider Notes (Signed)
[facility]-EMERGENCY DEPT Provider Note   CSN: [id number] Arrival date & time: 03/01/17  1128    History   Chief Complaint Chief Complaint  Patient presents with  . Leg Pain  . Leg Swelling    HPI Eric Kent is a 36 y.o. male with history of GERD and HTN who presents to the ED with complain of left lower extremity swelling, numbness, and tingling. States he notices left lower extremity swelling yesterday and has progressively worsened. Denies LLE pain. He was recently in a 1-hr long car ride. He also reports L eye redness of 2 days, no pain or discharge. Denies recent illness, but endorses fever, chills, cough, shortness of breath, and believes he has a STD because his urine has been cloudy for the past few days.  HPI  Past Medical History:  Diagnosis Date  . GERD (gastroesophageal reflux disease)   . Hypertension   . Ulcer     There are no active problems to display for this patient.   Past Surgical History:  Procedure Laterality Date  . TONSILLECTOMY         Home Medications    Prior to Admission medications   Medication Sig Start Date End Date Taking? Authorizing Provider  miconazole (MICOTIN) 2 % cream Apply 1 application topically 2 (two) times daily. 06/02/16   Joy, Hillard Danker, PA-C    Family History No family history on file.  Social History Social History  Substance Use Topics  . Smoking status: Former Smoker    Quit date: 09/16/2011  . Smokeless tobacco: Never Used  . Alcohol use No     Allergies   Penicillins   Review of Systems Review of Systems  Constitutional: Positive for chills, diaphoresis and fever. Negative for activity change and appetite change.  HENT: Positive for sore throat.   Respiratory: Positive for cough and shortness of breath.   Cardiovascular: Negative for chest pain and palpitations.  Musculoskeletal: Negative for joint swelling and myalgias.  Neurological: Positive for light-headedness and numbness.  All other systems  reviewed and are negative.    Physical Exam Updated Vital Signs BP (!) 171/107 (BP Location: Left Arm)   Pulse 80   Temp 98.2 F (36.8 C) (Oral)   Resp 18   Ht 6' (1.829 m)   Wt 113.4 kg (250 lb)   SpO2 100%   BMI 33.91 kg/m   Physical Exam  Constitutional: He appears well-developed and well-nourished. No distress.  Eyes:  There is a conjunctival injection bilaterally. PEERL and EOMI.   Cardiovascular: Normal rate, regular rhythm and normal heart sounds.  Exam reveals no gallop and no friction rub.   No murmur heard. Pulmonary/Chest: Effort normal and breath sounds normal. No respiratory distress. He has no wheezes. He has no rales.  Abdominal: Soft. Bowel sounds are normal.  Musculoskeletal:  LLE normal in appearance. No edema noted. Neurovascularly intact.      ED Treatments / Results  Labs (all labs ordered are listed, but only abnormal results are displayed) Labs Reviewed - No data to display  EKG  EKG Interpretation None       Radiology No results found.  Procedures Procedures (including critical care time)  Medications Ordered in ED Medications - No data to display   Initial Impression / Assessment and Plan / ED Course  I have reviewed the triage vital signs and the nursing notes.  Pertinent labs & imaging results that were available during my care of the patient were reviewed by me and  considered in my medical decision making (see chart for details).    Eric Kent is a 36 y.o. male with history of GERD and HTN who presents to the ED with several complaints including left lower extremity swelling, numbness, and tingling. His examination is normal today. He appears to have recently use marijuana prior to arrival to ED given conjuctival injection and comments indicative of paranoia. No abnormalities seen on exam of LE bilaterally. He was explained he did not have a LLE DVT and that there were no emergent medical problems for Eric Kent to treat. He was  recommended to stop using marijuana and follow up with health department regarding concern for STI.    Final Clinical Impressions(s) / ED Diagnoses   Final diagnoses:  Pain of left lower extremity    New Prescriptions Discharge Medication List as of 03/01/2017 12:24 PM       Burna Cash, MD 03/01/17 1251    Rolland Porter, MD 03/11/17 [phone]

## 2017-03-01 NOTE — ED Provider Notes (Signed)
[facility]-EMERGENCY DEPT Provider Note   CSN: [id number] Arrival date & time: 03/01/17  1128     History   Chief Complaint Chief Complaint  Patient presents with  . Leg Pain  . Leg Swelling    HPI Eric Kent is a 36 y.o. male. CC: leg pain  HPI:  36 year old male. Multiple complaints. Main complaint is left leg is painful. States is actually tingling. His been this way for 2 days. He is walking without a limp or weakness. No posterior leg pain. No back pain. No fevers no injury. Also states concerns that he may have an STI. States sometimes his eyes are red. No drainage from his eyes. No vision changes. No fevers no generalized symptoms.  Past Medical History:  Diagnosis Date  . GERD (gastroesophageal reflux disease)   . Hypertension   . Ulcer     There are no active problems to display for this patient.   Past Surgical History:  Procedure Laterality Date  . TONSILLECTOMY         Home Medications    Prior to Admission medications   Medication Sig Start Date End Date Taking? Authorizing Provider  miconazole (MICOTIN) 2 % cream Apply 1 application topically 2 (two) times daily. 06/02/16   Joy, Hillard Danker, PA-C    Family History No family history on file.  Social History Social History  Substance Use Topics  . Smoking status: Former Smoker    Quit date: 09/16/2011  . Smokeless tobacco: Never Used  . Alcohol use No     Allergies   Penicillins   Review of Systems Review of Systems  Eyes: Positive for redness.  Musculoskeletal: Positive for arthralgias and myalgias.     Physical Exam Updated Vital Signs BP (!) 171/107 (BP Location: Left Arm)   Pulse 80   Temp 98.2 F (36.8 C) (Oral)   Resp 18   Ht 6' (1.829 m)   Wt 113.4 kg (250 lb)   SpO2 100%   BMI 33.91 kg/m   Physical Exam  Constitutional: He is oriented to person, place, and time. He appears well-developed and well-nourished. No distress.  HENT:  Head: Normocephalic.  Eyes: Pupils are  equal, round, and reactive to light. Conjunctivae are normal. No scleral icterus.  Conjunctival injection of the bulbar conjunctiva. Palpebral conjunctivae are normal.  Tissues are normal. No soft tissue swelling to the lids or lacrimal apparatus or glands.  Neck: Normal range of motion. Neck supple. No thyromegaly present.  Cardiovascular: Normal rate and regular rhythm.  Exam reveals no gallop and no friction rub.   No murmur heard. Pulmonary/Chest: Effort normal and breath sounds normal. No respiratory distress. He has no wheezes. He has no rales.  Abdominal: Soft. Bowel sounds are normal. He exhibits no distension. There is no tenderness. There is no rebound.  Musculoskeletal: Normal range of motion.  Normal comfort select left right. Nontender. Normal pulses. No sign of infection.  Neurological: He is alert and oriented to person, place, and time.  Skin: Skin is warm and dry. No rash noted.  Psychiatric: He has a normal mood and affect. His behavior is normal.     ED Treatments / Results  Labs (all labs ordered are listed, but only abnormal results are displayed) Labs Reviewed - No data to display  EKG  EKG Interpretation None       Radiology No results found.  Procedures Procedures (including critical care time)  Medications Ordered in ED Medications - No data to display  Initial Impression / Assessment and Plan / ED Course  I have reviewed the triage vital signs and the nursing notes.  Pertinent labs & imaging results that were available during my care of the patient were reviewed by me and considered in my medical decision making (see chart for details).     Normal exam. May be simple positional symptoms. Skull skeletal pain. Smells strongly of marijuana. This may be why he has  conjunctival injection. Does not appear infected. Referred to health department with any concern for STDs. Nonemergency conditions today.  Final Clinical Impressions(s) / ED Diagnoses     Final diagnoses:  Pain of left lower extremity    New Prescriptions Discharge Medication List as of 03/01/2017 12:24 PM       Rolland Porter, MD 03/01/17 [phone]

## 2017-03-01 NOTE — Discharge Instructions (Signed)
No emergency condition was found on your exam today. If you have concern about STDs go to [facility] for testing.

## 2017-06-17 ENCOUNTER — Emergency Department (HOSPITAL_COMMUNITY)
Admission: EM | Admit: 2017-06-17 | Discharge: 2017-06-17 | Disposition: A | Discharge status: Home / Self Care | Payer: PRIVATE HEALTH INSURANCE | Attending: Emergency Medicine | Admitting: Emergency Medicine

## 2017-06-17 ENCOUNTER — Encounter (HOSPITAL_COMMUNITY): Payer: Self-pay | Admitting: *Deleted

## 2017-06-17 ENCOUNTER — Other Ambulatory Visit: Payer: Self-pay

## 2017-06-17 DIAGNOSIS — R369 Urethral discharge, unspecified: Secondary | ICD-10-CM | POA: Insufficient documentation

## 2017-06-17 DIAGNOSIS — I10 Essential (primary) hypertension: Secondary | ICD-10-CM | POA: Insufficient documentation

## 2017-06-17 DIAGNOSIS — R1032 Left lower quadrant pain: Secondary | ICD-10-CM | POA: Insufficient documentation

## 2017-06-17 DIAGNOSIS — Z87891 Personal history of nicotine dependence: Secondary | ICD-10-CM | POA: Insufficient documentation

## 2017-06-17 LAB — URINALYSIS, ROUTINE W REFLEX MICROSCOPIC
GLUCOSE, UA: NEGATIVE mg/dL
HGB URINE DIPSTICK: NEGATIVE
KETONES UR: NEGATIVE mg/dL
NITRITE: NEGATIVE
PH: 8 (ref 5.0–8.0)
Protein, ur: 30 mg/dL — AB
Specific Gravity, Urine: 1.026 (ref 1.005–1.030)

## 2017-06-17 LAB — COMPREHENSIVE METABOLIC PANEL
ALT: 18 U/L (ref 17–63)
AST: 20 U/L (ref 15–41)
Albumin: 3.7 g/dL (ref 3.5–5.0)
Alkaline Phosphatase: 95 U/L (ref 38–126)
Anion gap: 11 (ref 5–15)
BUN: 6 mg/dL (ref 6–20)
CALCIUM: 9.1 mg/dL (ref 8.9–10.3)
CHLORIDE: 106 mmol/L (ref 101–111)
CO2: 23 mmol/L (ref 22–32)
CREATININE: 1.14 mg/dL (ref 0.61–1.24)
Glucose, Bld: 96 mg/dL (ref 65–99)
Potassium: 4 mmol/L (ref 3.5–5.1)
Sodium: 140 mmol/L (ref 135–145)
TOTAL PROTEIN: 6.7 g/dL (ref 6.5–8.1)
Total Bilirubin: 0.7 mg/dL (ref 0.3–1.2)

## 2017-06-17 LAB — CBC
HCT: 46.6 % (ref 39.0–52.0)
Hemoglobin: 15.8 g/dL (ref 13.0–17.0)
MCH: 29.9 pg (ref 26.0–34.0)
MCHC: 33.9 g/dL (ref 30.0–36.0)
MCV: 88.3 fL (ref 78.0–100.0)
PLATELETS: 235 10*3/uL (ref 150–400)
RBC: 5.28 MIL/uL (ref 4.22–5.81)
RDW: 12.8 % (ref 11.5–15.5)
WBC: 4.4 10*3/uL (ref 4.0–10.5)

## 2017-06-17 LAB — LIPASE, BLOOD: LIPASE: 25 U/L (ref 11–51)

## 2017-06-17 MED ORDER — DOXYCYCLINE HYCLATE 100 MG PO CAPS: 100.0000 mg | ORAL_CAPSULE | Freq: Two times a day (BID) | ORAL | 0 refills | Status: AC

## 2017-06-17 MED ORDER — CEFTRIAXONE SODIUM 250 MG IJ SOLR
250.0000 mg | Freq: Once | INTRAMUSCULAR | Status: AC
  Administered 2017-06-17: 250 mg via INTRAMUSCULAR
  Filled 2017-06-17: qty 250

## 2017-06-17 MED ORDER — LIDOCAINE HCL (PF) 1 % IJ SOLN
INTRAMUSCULAR | Status: AC
  Filled 2017-06-17: qty 5

## 2017-06-17 MED ORDER — DOXYCYCLINE HYCLATE 100 MG PO TABS: 100.0000 mg | ORAL_TABLET | Freq: Two times a day (BID) | ORAL | Status: DC

## 2017-06-17 MED ORDER — METRONIDAZOLE 500 MG PO TABS
2000.0000 mg | ORAL_TABLET | Freq: Once | ORAL | Status: AC
  Administered 2017-06-17: 2000 mg via ORAL
  Filled 2017-06-17: qty 4

## 2017-06-17 MED ORDER — DOXYCYCLINE HYCLATE 100 MG PO TABS
100.0000 mg | ORAL_TABLET | Freq: Once | ORAL | Status: AC
Start: 2017-06-17 — End: 2017-06-17
  Administered 2017-06-17: 100 mg via ORAL
  Filled 2017-06-17: qty 1

## 2017-06-17 NOTE — ED Provider Notes (Signed)
[facility] Provider Note  CSN: [id number] Arrival date & time: 06/17/17 1535  Chief Complaint(s) Abdominal Pain and Exposure to STD  HPI Eric Kent is a 37 y.o. male   The history is provided by the patient.  Exposure to STD  This is a recurrent problem. The current episode started 2 days ago. The problem occurs constantly. The problem has not changed since onset.Pertinent negatives include no chest pain, no abdominal pain, no headaches and no shortness of breath. Nothing aggravates the symptoms. Nothing relieves the symptoms. He has tried nothing for the symptoms.  Abdominal Pain   This is a new problem. The current episode started 2 days ago. The problem occurs constantly. The problem has not changed since onset.The pain is associated with an unknown factor. The pain is located in the LLQ. The quality of the pain is pressure-like. The pain is mild. Associated symptoms include constipation and dysuria. Pertinent negatives include fever, diarrhea, nausea, vomiting and headaches. Nothing aggravates the symptoms. Nothing relieves the symptoms.    Past Medical History Past Medical History:  Diagnosis Date  . GERD (gastroesophageal reflux disease)   . Hypertension   . Ulcer    There are no active problems to display for this patient.  Home Medication(s) Prior to Admission medications   Medication Sig Start Date End Date Taking? Authorizing Provider  doxycycline (VIBRAMYCIN) 100 MG capsule Take 1 capsule (100 mg total) by mouth 2 (two) times daily for 10 days. 06/17/17 06/27/17  Nira Connardama, Jamy Cleckler Eduardo, MD  miconazole (MICOTIN) 2 % cream Apply 1 application topically 2 (two) times daily. 06/02/16   Anselm PancoastJoy, Shawn C, PA-C                                                                                                                                    Past Surgical History Past Surgical History:  Procedure Laterality Date  . TONSILLECTOMY     Family  History History reviewed. No pertinent family history.  Social History Social History   Tobacco Use  . Smoking status: Former Smoker    Last attempt to quit: 09/16/2011    Years since quitting: 5.7  . Smokeless tobacco: Never Used  Substance Use Topics  . Alcohol use: No  . Drug use: No   Allergies Penicillins  Review of Systems Review of Systems  Constitutional: Negative for fever.  Respiratory: Negative for shortness of breath.   Cardiovascular: Negative for chest pain.  Gastrointestinal: Positive for constipation. Negative for abdominal pain, diarrhea, nausea and vomiting.  Genitourinary: Positive for dysuria.  Neurological: Negative for headaches.   All other systems are reviewed and are negative for acute change except as noted in the HPI  Physical Exam Vital Signs  I have reviewed the triage vital signs BP (!) 134/95 (BP Location: Right Arm)   Pulse 78   Temp 98 F (36.7 C) (Oral)   SpO2 100%  Physical Exam  Constitutional: He is oriented to person, place, and time. He appears well-developed and well-nourished. No distress.  HENT:  Head: Normocephalic and atraumatic.  Right Ear: External ear normal.  Left Ear: External ear normal.  Nose: Nose normal.  Mouth/Throat: Mucous membranes are normal. No trismus in the jaw.  Eyes: Conjunctivae and EOM are normal. No scleral icterus.  Neck: Normal range of motion and phonation normal.  Cardiovascular: Normal rate and regular rhythm.  Pulmonary/Chest: Effort normal. No stridor. No respiratory distress.  Abdominal: He exhibits no distension. There is no tenderness. There is no rigidity, no rebound and no guarding. Hernia confirmed negative in the right inguinal area and confirmed negative in the left inguinal area.  Genitourinary: Testes normal. Right testis shows no tenderness. Left testis shows no tenderness. Circumcised. Discharge (mild) found.  Musculoskeletal: Normal range of motion. He exhibits no edema.    Lymphadenopathy: No inguinal adenopathy noted on the right or left side.  Neurological: He is alert and oriented to person, place, and time.  Skin: He is not diaphoretic.  Psychiatric: He has a normal mood and affect. His behavior is normal.  Vitals reviewed.   ED Results and Treatments Labs (all labs ordered are listed, but only abnormal results are displayed) Labs Reviewed  URINALYSIS, ROUTINE W REFLEX MICROSCOPIC - Abnormal; Notable for the following components:      Result Value   APPearance HAZY (*)    Bilirubin Urine SMALL (*)    Protein, ur 30 (*)    Leukocytes, UA MODERATE (*)    Bacteria, UA RARE (*)    Squamous Epithelial / LPF 0-5 (*)    All other components within normal limits  LIPASE, BLOOD  COMPREHENSIVE METABOLIC PANEL  CBC  GC/CHLAMYDIA PROBE AMP ([place]) NOT AT [facility]                                                                                                                         EKG  EKG Interpretation  Date/Time:    Ventricular Rate:    PR Interval:    QRS Duration:   QT Interval:    QTC Calculation:   R Axis:     Text Interpretation:        Radiology No results found. Pertinent labs & imaging results that were available during my care of the patient were reviewed by me and considered in my medical decision making (see chart for details).  Medications Ordered in ED Medications  lidocaine (PF) (XYLOCAINE) 1 % injection (not administered)  doxycycline (VIBRA-TABS) tablet 100 mg (100 mg Oral Given 06/17/17 1922)  metroNIDAZOLE (FLAGYL) tablet 2,000 mg (2,000 mg Oral Given 06/17/17 1922)  cefTRIAXone (ROCEPHIN) injection 250 mg (250 mg Intramuscular Given 06/17/17 1922)  Procedures Procedures  (including critical care time)  Medical Decision Making / ED Course I have reviewed the nursing notes for this  encounter and the patient's prior records (if available in EHR or on provided paperwork).    Advised to practice safe sex and have all partners evaluated and treated at the local health department. Also advised to follow with the health Department in 1-2 weeks to confirm effectiveness of treatment and receive additional education/evaluation. Return precautions given.   Patient is describing left lower quadrant abdominal discomfort.  His abdomen is benign.  Screening labs are grossly reassuring.  Low suspicion for serious intra-abdominal inflammatory/infectious process.  Do not feel that advanced imaging is necessary at this time.  The patient appears reasonably screened and/or stabilized for discharge and I doubt any other medical condition or other [facility] requiring further screening, evaluation, or treatment in the ED at this time prior to discharge.  The patient is safe for discharge with strict return precautions.   Final Clinical Impression(s) / ED Diagnoses Final diagnoses:  Penile discharge    Disposition: Discharge  Condition: Good  I have discussed the results, Dx and Tx plan with the patient who expressed understanding and agree(s) with the plan. Discharge instructions discussed at great length. The patient was given strict return precautions who verbalized understanding of the instructions. No further questions at time of discharge.    ED Discharge Orders        Ordered    doxycycline (VIBRAMYCIN) 100 MG capsule  2 times daily     06/17/17 1933       Follow Up: Department, [facility] [address] [place] [address] [phone]  In 10 days To ensure successful treatment     This chart was dictated using voice recognition software.  Despite best efforts to proofread,  errors can occur which can change the documentation meaning.   Nira Conn, MD 06/17/17 [phone]

## 2017-06-17 NOTE — Discharge Instructions (Signed)
Uou are advised to practice safe sex and have all partners evaluated and treated at the local health department. Also advised to follow with the health Department in 1-2 weeks to confirm effectiveness of treatment and receive additional education/evaluation. Return precautions given.

## 2017-06-17 NOTE — ED Triage Notes (Signed)
Pt reports left side pain with diarrhea. Also reports exposure to std and has penile discharge. No distress is noted at triage.

## 2017-06-17 NOTE — ED Notes (Signed)
Pt didn't answer in lobby

## 2017-06-18 LAB — GC/CHLAMYDIA PROBE AMP (~~LOC~~) NOT AT ARMC
Chlamydia: NEGATIVE
NEISSERIA GONORRHEA: POSITIVE — AB

## 2017-07-21 ENCOUNTER — Encounter (HOSPITAL_COMMUNITY): Payer: Self-pay

## 2017-07-21 ENCOUNTER — Other Ambulatory Visit: Payer: Self-pay

## 2017-07-21 ENCOUNTER — Emergency Department (HOSPITAL_COMMUNITY)
Admission: EM | Admit: 2017-07-21 | Discharge: 2017-07-21 | Disposition: A | Discharge status: Home / Self Care | Payer: PRIVATE HEALTH INSURANCE | Attending: Emergency Medicine | Admitting: Emergency Medicine

## 2017-07-21 DIAGNOSIS — I10 Essential (primary) hypertension: Secondary | ICD-10-CM | POA: Insufficient documentation

## 2017-07-21 DIAGNOSIS — Z760 Encounter for issue of repeat prescription: Secondary | ICD-10-CM

## 2017-07-21 DIAGNOSIS — Z87891 Personal history of nicotine dependence: Secondary | ICD-10-CM | POA: Insufficient documentation

## 2017-07-21 MED ORDER — DOXYCYCLINE HYCLATE 100 MG PO CAPS: 100.0000 mg | ORAL_CAPSULE | Freq: Two times a day (BID) | ORAL | 0 refills | Status: DC

## 2017-07-21 NOTE — Discharge Instructions (Signed)
Follow up with the health department

## 2017-07-21 NOTE — ED Provider Notes (Signed)
  [facility] Provider Note   CSN: [id number] Arrival date & time: 07/21/17  2210     History   Chief Complaint Chief Complaint  Patient presents with  . Exposure to STD  . Medication Refill    HPI Eric Kent is a 37 y.o. male presents to the ED  With request for medication refill. He was evaluated here and tested for STI's and was positive for GC. Patient reports that he did not take his doxycycline as directed and request another Rx so he can take it. Patient denies any other problems.  HPI  Past Medical History:  Diagnosis Date  . GERD (gastroesophageal reflux disease)   . Hypertension   . Ulcer     There are no active problems to display for this patient.   Past Surgical History:  Procedure Laterality Date  . TONSILLECTOMY         Home Medications    Prior to Admission medications   Medication Sig Start Date End Date Taking? Authorizing Provider  doxycycline (VIBRAMYCIN) 100 MG capsule Take 1 capsule (100 mg total) by mouth 2 (two) times daily. 07/21/17   Janne NapoleonNeese, Hope M, NP  miconazole (MICOTIN) 2 % cream Apply 1 application topically 2 (two) times daily. 06/02/16   Joy, Hillard DankerShawn C, PA-C    Family History History reviewed. No pertinent family history.  Social History Social History   Tobacco Use  . Smoking status: Former Smoker    Last attempt to quit: 09/16/2011    Years since quitting: 5.8  . Smokeless tobacco: Never Used  Substance Use Topics  . Alcohol use: Yes    Comment: occ  . Drug use: No     Allergies   Penicillins   Review of Systems Review of Systems  All other systems reviewed and are negative.    Physical Exam Updated Vital Signs BP 136/88 (BP Location: Right Arm)   Pulse 76   Temp 98.5 F (36.9 C) (Oral)   Ht 6' (1.829 m)   Wt 104.3 kg (230 lb)   SpO2 99%   BMI 31.19 kg/m   Physical Exam  Constitutional: He appears well-developed and well-nourished. No distress.  HENT:  Head:  Normocephalic.  Eyes: EOM are normal.  Neck: Neck supple.  Cardiovascular: Normal rate.  Pulmonary/Chest: Effort normal.  Musculoskeletal: Normal range of motion.  Neurological: He is alert.  Skin: Skin is warm and dry.  Psychiatric: He has a normal mood and affect.  Nursing note and vitals reviewed.    ED Treatments / Results  Labs (all labs ordered are listed, but only abnormal results are displayed) Labs Reviewed - No data to display  Radiology No results found.  Procedures Procedures (including critical care time)  Medications Ordered in ED Medications - No data to display   Initial Impression / Assessment and Plan / ED Course  I have reviewed the triage vital signs and the nursing notes. 37 y.o. male here for medication refill after he did not take his doxycycline as directed from his last visit. Stable for d/c to f/u with [facility].  Final Clinical Impressions(s) / ED Diagnoses   Final diagnoses:  Medication refill    ED Discharge Orders        Ordered    doxycycline (VIBRAMYCIN) 100 MG capsule  2 times daily     07/21/17 2250       Kerrie Buffaloeese, Hope AcampoM, [place]NP 07/21/17 2252    Margarita Grizzleay, Danielle, MD 07/22/17 [phone]

## 2017-07-21 NOTE — ED Triage Notes (Signed)
Pt endorses being treated for gonorrhea recently here, pt states that he was given antibiotics but states "I don't think I took the antibiotics correctly, I missed several doses but I did finish it" Pt wants medication refill so he can take it correctly. VSS.

## 2017-07-29 ENCOUNTER — Other Ambulatory Visit: Payer: Self-pay

## 2017-07-29 ENCOUNTER — Emergency Department (HOSPITAL_COMMUNITY)
Admission: EM | Admit: 2017-07-29 | Discharge: 2017-07-29 | Disposition: A | Discharge status: Home / Self Care | Payer: Self-pay | Attending: Emergency Medicine | Admitting: Emergency Medicine

## 2017-07-29 DIAGNOSIS — I10 Essential (primary) hypertension: Secondary | ICD-10-CM | POA: Insufficient documentation

## 2017-07-29 DIAGNOSIS — R1032 Left lower quadrant pain: Secondary | ICD-10-CM | POA: Insufficient documentation

## 2017-07-29 DIAGNOSIS — Z87891 Personal history of nicotine dependence: Secondary | ICD-10-CM | POA: Insufficient documentation

## 2017-07-29 LAB — URINALYSIS, ROUTINE W REFLEX MICROSCOPIC
BILIRUBIN URINE: NEGATIVE
GLUCOSE, UA: NEGATIVE mg/dL
HGB URINE DIPSTICK: NEGATIVE
KETONES UR: 5 mg/dL — AB
LEUKOCYTES UA: NEGATIVE
Nitrite: NEGATIVE
PROTEIN: 30 mg/dL — AB
Specific Gravity, Urine: 1.029 (ref 1.005–1.030)
Squamous Epithelial / LPF: NONE SEEN
pH: 6 (ref 5.0–8.0)

## 2017-07-29 LAB — CBC WITH DIFFERENTIAL/PLATELET
BASOS ABS: 0 10*3/uL (ref 0.0–0.1)
BASOS PCT: 1 %
EOS ABS: 0.1 10*3/uL (ref 0.0–0.7)
EOS PCT: 3 %
HCT: 44 % (ref 39.0–52.0)
Hemoglobin: 14.2 g/dL (ref 13.0–17.0)
Lymphocytes Relative: 44 %
Lymphs Abs: 1.9 10*3/uL (ref 0.7–4.0)
MCH: 29.3 pg (ref 26.0–34.0)
MCHC: 32.3 g/dL (ref 30.0–36.0)
MCV: 90.9 fL (ref 78.0–100.0)
MONO ABS: 0.4 10*3/uL (ref 0.1–1.0)
Monocytes Relative: 9 %
NEUTROS ABS: 1.9 10*3/uL (ref 1.7–7.7)
Neutrophils Relative %: 45 %
PLATELETS: 252 10*3/uL (ref 150–400)
RBC: 4.84 MIL/uL (ref 4.22–5.81)
RDW: 13.1 % (ref 11.5–15.5)
WBC: 4.3 10*3/uL (ref 4.0–10.5)

## 2017-07-29 LAB — BASIC METABOLIC PANEL
ANION GAP: 7 (ref 5–15)
BUN: 11 mg/dL (ref 6–20)
CALCIUM: 9.1 mg/dL (ref 8.9–10.3)
CO2: 28 mmol/L (ref 22–32)
CREATININE: 1.18 mg/dL (ref 0.61–1.24)
Chloride: 106 mmol/L (ref 101–111)
GFR calc Af Amer: >60 mL/min (ref >60)
Glucose, Bld: 108 mg/dL — ABNORMAL HIGH (ref 65–99)
Potassium: 4.1 mmol/L (ref 3.5–5.1)
Sodium: 141 mmol/L (ref 135–145)

## 2017-07-29 NOTE — ED Provider Notes (Signed)
[facility]-EMERGENCY DEPT Provider Note   CSN: [id number] Arrival date & time: 07/29/17  1647     History   Chief Complaint Chief Complaint  Patient presents with  . Groin Pain    HPI Eric Kent is a 37 y.o. male who presents to the ED with groin pain. The patient was evaluated at [facility] 06/17/17 and cultures were positive for Gonorrhea. Patient was treated with Rocephin 250 mg IM, Flagyl 2 G PO and doxycycline 100 mg. Patient was d/c home with Rx for doxycycline and told to f/u with the [facility]. Patient returned to [facility] 07/21/17 stating that he needed a refill on his doxycycline because he didn't think he took it correctly and missed some doses. Patient was given Rx for doxycycline and told to f/u with the [facility].  Patient here today c/o groin pain and thinks it may be due to infection. Patient denies testicular swelling or pain, discharge or pain with urination.   HPI  Past Medical History:  Diagnosis Date  . GERD (gastroesophageal reflux disease)   . Hypertension   . Ulcer     There are no active problems to display for this patient.   Past Surgical History:  Procedure Laterality Date  . TONSILLECTOMY         Home Medications    Prior to Admission medications   Medication Sig Start Date End Date Taking? Authorizing Provider  doxycycline (VIBRAMYCIN) 100 MG capsule Take 1 capsule (100 mg total) by mouth 2 (two) times daily. 07/21/17   Janne Napoleon, NP  miconazole (MICOTIN) 2 % cream Apply 1 application topically 2 (two) times daily. 06/02/16   Joy, Hillard Danker, PA-C    Family History No family history on file.  Social History Social History   Tobacco Use  . Smoking status: Former Smoker    Last attempt to quit: 09/16/2011    Years since quitting: 5.8  . Smokeless tobacco: Never Used  Substance Use Topics  . Alcohol use: Yes    Comment: occ  . Drug use: No     Allergies   Penicillins   Review of Systems Review of Systems    Gastrointestinal: Positive for abdominal pain (left groin pain).  All other systems reviewed and are negative.    Physical Exam Updated Vital Signs BP (!) 173/89 (BP Location: Left Arm)   Pulse 73   Temp 98.3 F (36.8 C) (Oral)   Resp 18   Wt 104.3 kg (230 lb)   SpO2 99%   BMI 31.19 kg/m   Physical Exam  Constitutional: He appears well-developed and well-nourished.  Eyes: EOM are normal.  Neck: Neck supple.  Cardiovascular: Normal rate.  Pulmonary/Chest: Effort normal.  Abdominal: Soft.  Mildly tender with palpation to the left inguinal area. No inguinal nodes palpated.   Musculoskeletal: Normal range of motion.  Neurological: He is alert.  Skin: Skin is warm and dry.  Psychiatric: He has a normal mood and affect.  Nursing note and vitals reviewed.    ED Treatments / Results  Labs (all labs ordered are listed, but only abnormal results are displayed) Labs Reviewed  URINALYSIS, ROUTINE W REFLEX MICROSCOPIC - Abnormal; Notable for the following components:      Result Value   Ketones, ur 5 (*)    Protein, ur 30 (*)    Bacteria, UA RARE (*)    All other components within normal limits  BASIC METABOLIC PANEL - Abnormal; Notable for the following components:   Glucose,  Bld 108 (*)    All other components within normal limits  CBC WITH DIFFERENTIAL/PLATELET  GC/CHLAMYDIA PROBE AMP ([place]) NOT AT [facility]    Radiology No results found.  Procedures Procedures (including critical care time)  Medications Ordered in ED Medications - No data to display Dr. Criss AlvineGoldston in to examine the patient and discuss f/u plan.  Initial Impression / Assessment and Plan / ED Course  I have reviewed the triage vital signs and the nursing notes. 37 y.o. male with multiple visits to the ED with concern for STI's and other infections. Dr. Criss AlvineGoldston discussed with the patient referral to ID clinic for further evaluation. Phone number given to the patient. Return precautions discussed.    Final Clinical Impressions(s) / ED Diagnoses   Final diagnoses:  Left groin pain    ED Discharge Orders    None       Kerrie Buffaloeese, Kajah Santizo BolanM, [place]NP 07/29/17 2206    Pricilla LovelessGoldston, Scott, MD 07/29/17 [phone]

## 2017-07-29 NOTE — ED Notes (Signed)
Pt declined dc vitals.

## 2017-07-29 NOTE — Discharge Instructions (Signed)
Call the Infectious Disease Clinic for a follow up appointment.  °

## 2017-07-30 LAB — GC/CHLAMYDIA PROBE AMP (~~LOC~~) NOT AT ARMC
CHLAMYDIA, DNA PROBE: NEGATIVE
Neisseria Gonorrhea: NEGATIVE

## 2017-10-01 ENCOUNTER — Ambulatory Visit (HOSPITAL_COMMUNITY)
Admission: EM | Admit: 2017-10-01 | Discharge: 2017-10-01 | Disposition: A | Discharge status: Home / Self Care | Payer: Medicaid Other | Attending: Family Medicine | Admitting: Family Medicine

## 2017-10-01 ENCOUNTER — Ambulatory Visit (HOSPITAL_COMMUNITY)
Admission: EM | Admit: 2017-10-01 | Discharge: 2017-10-01 | Disposition: A | Discharge status: Home / Self Care | Payer: Self-pay

## 2017-10-01 ENCOUNTER — Encounter (HOSPITAL_COMMUNITY): Payer: Self-pay | Admitting: Family Medicine

## 2017-10-01 DIAGNOSIS — I10 Essential (primary) hypertension: Secondary | ICD-10-CM | POA: Insufficient documentation

## 2017-10-01 DIAGNOSIS — Z87891 Personal history of nicotine dependence: Secondary | ICD-10-CM | POA: Insufficient documentation

## 2017-10-01 DIAGNOSIS — K5900 Constipation, unspecified: Secondary | ICD-10-CM

## 2017-10-01 DIAGNOSIS — K5901 Slow transit constipation: Secondary | ICD-10-CM | POA: Insufficient documentation

## 2017-10-01 DIAGNOSIS — K219 Gastro-esophageal reflux disease without esophagitis: Secondary | ICD-10-CM | POA: Insufficient documentation

## 2017-10-01 DIAGNOSIS — R109 Unspecified abdominal pain: Secondary | ICD-10-CM | POA: Insufficient documentation

## 2017-10-01 LAB — POCT URINALYSIS DIP (DEVICE)
BILIRUBIN URINE: NEGATIVE
GLUCOSE, UA: NEGATIVE mg/dL
Hgb urine dipstick: NEGATIVE
Nitrite: NEGATIVE
PH: 5.5 (ref 5.0–8.0)
Protein, ur: NEGATIVE mg/dL
Urobilinogen, UA: 0.2 mg/dL (ref 0.0–1.0)

## 2017-10-01 LAB — POCT I-STAT, CHEM 8
BUN: 8 mg/dL (ref 6–20)
CALCIUM ION: 1.22 mmol/L (ref 1.15–1.40)
CHLORIDE: 102 mmol/L (ref 101–111)
Creatinine, Ser: 1.4 mg/dL — ABNORMAL HIGH (ref 0.61–1.24)
GLUCOSE: 66 mg/dL (ref 65–99)
HCT: 48 % (ref 39.0–52.0)
Hemoglobin: 16.3 g/dL (ref 13.0–17.0)
POTASSIUM: 3.9 mmol/L (ref 3.5–5.1)
Sodium: 141 mmol/L (ref 135–145)
TCO2: 28 mmol/L (ref 22–32)

## 2017-10-01 MED ORDER — PSYLLIUM 58.6 % PO PACK: 1.0000 | PACK | Freq: Every day | ORAL | 0 refills | Status: DC

## 2017-10-01 NOTE — Discharge Instructions (Signed)
Your test results are inconsistent at this time. Please increase your fluid intake regularly. Please increase fiber in your diet, may use metamucil daily to help promote soft and regular stools without straining. I have sent your urine for culture to test for kidney infection as well as for std's.  We will call with any positive findings from this. Please follow up for recheck in the next 2 weeks, please establish with a primary care provider.  If you develop worsening of pain, nausea, vomiting, diarrhea, fevers, blood in urine, pain or frequency with urination please return or go to Er.

## 2017-10-01 NOTE — ED Triage Notes (Signed)
Pt here for left flank pain x 1 month. He has been drinking a lot of cranberry juice and water. sts that pain has been persistent x 1 month and describes as a tightening.

## 2017-10-01 NOTE — ED Provider Notes (Signed)
[facility]    CSN: [id number] Arrival date & time: 10/01/17  1402     History   Chief Complaint Chief Complaint  Patient presents with  . Flank Pain    HPI Eric Kent is a 37 y.o. male.   Eric Kent presents with complaints of left flank/abdominal pain which has been ongoing for approximately 1 month. He states it feels "tight."  Worse after he smokes or after he has intercourse. Denies any previous similar. No fevers. Pain 7/10. Has not taken any medications for symptoms. States he feels he has a difficult time initiating urine stream after intercourse. He has two partners and uses condoms. Did get treatment for gonorrhea in march and had been recommended to see ID at that time. Movement does not exacerbate pain. Denies urinary frequency, urgency or pain with urination. No blood to urine. Denies penile discharge, swelling, lesions or sores. States he has a BM approximately daily but endorses having to strain to pass. States when the tightening sensation occurs he feels he can't move or it will worsen pain.    ROS per HPI.      Past Medical History:  Diagnosis Date  . GERD (gastroesophageal reflux disease)   . Hypertension   . Ulcer     There are no active problems to display for this patient.   Past Surgical History:  Procedure Laterality Date  . TONSILLECTOMY         Home Medications    Prior to Admission medications   Medication Sig Start Date End Date Taking? Authorizing Provider  miconazole (MICOTIN) 2 % cream Apply 1 application topically 2 (two) times daily. 06/02/16   Joy, Shawn C, PA-C  psyllium (METAMUCIL) 58.6 % packet Take 1 packet by mouth daily. 10/01/17   Georgetta Haber, NP    Family History History reviewed. No pertinent family history.  Social History Social History   Tobacco Use  . Smoking status: Former Smoker    Last attempt to quit: 09/16/2011    Years since quitting: 6.0  . Smokeless tobacco: Never Used  Substance  Use Topics  . Alcohol use: Yes    Comment: occ  . Drug use: No     Allergies   Penicillins   Review of Systems Review of Systems   Physical Exam Triage Vital Signs ED Triage Vitals  Enc Vitals Group     BP 10/01/17 1440 (!) 144/85     Pulse Rate 10/01/17 1440 81     Resp 10/01/17 1440 18     Temp --      Temp src --      SpO2 10/01/17 1440 98 %     Weight --      Height --      Head Circumference --      Peak Flow --      Pain Score 10/01/17 1438 6     Pain Loc --      Pain Edu? --      Excl. in GC? --    No data found.  Updated Vital Signs BP (!) 144/85   Pulse 81   Resp 18   SpO2 98%    Physical Exam  Constitutional: He is oriented to person, place, and time. He appears well-developed and well-nourished.  Cardiovascular: Normal rate and regular rhythm.  Pulmonary/Chest: Effort normal and breath sounds normal.  Abdominal: Soft. Bowel sounds are normal. There is tenderness in the suprapubic area and left lower quadrant. There is CVA  tenderness. There is no rigidity, no rebound, no guarding, no tenderness at McBurney's point and negative Murphy's sign.  Neurological: He is alert and oriented to person, place, and time.  Skin: Skin is warm and dry.     UC Treatments / Results  Labs (all labs ordered are listed, but only abnormal results are displayed) Labs Reviewed  POCT URINALYSIS DIP (DEVICE) - Abnormal; Notable for the following components:      Result Value   Ketones, ur TRACE (*)    Leukocytes, UA TRACE (*)    All other components within normal limits  POCT I-STAT, CHEM 8 - Abnormal; Notable for the following components:   Creatinine, Ser 1.40 (*)    All other components within normal limits  URINE CULTURE  URINE CYTOLOGY ANCILLARY ONLY    EKG None  Radiology No results found.  Procedures Procedures (including critical care time)  Medications Ordered in UC Medications - No data to display  Initial Impression / Assessment and Plan /  UC Course  I have reviewed the triage vital signs and the nursing notes.  Pertinent labs & imaging results that were available during my care of the patient were reviewed by me and considered in my medical decision making (see chart for details).     Non toxic in appearance. Very mild left sided flank and pelvic abdominal pain which has been ongoing for the past month. No fevers or urinary symptoms. No blood to urine. Has a history of STD's, most recent treatment in March 2019 and positive in January 2019. Urine cytology and urine culture pending at this time. Will treat according to results, UTI, pyelo, std's considered at this time. Will provide metamucil to help with mild constipation. Noted elevation to creatinine. Continue to increase fluid intake. Encouraged follow up and establish with a primary care provider for close follow up and recheck within the next 2-3 weeks. Return precautions provided. Patient verbalized understanding and agreeable to plan.     Case discussed with supervising physician dr. Delton See who is agreeable with plan.    Final Clinical Impressions(s) / UC Diagnoses   Final diagnoses:  Left flank pain  Slow transit constipation     Discharge Instructions     Your test results are inconsistent at this time. Please increase your fluid intake regularly. Please increase fiber in your diet, may use metamucil daily to help promote soft and regular stools without straining. I have sent your urine for culture to test for kidney infection as well as for std's.  We will call with any positive findings from this. Please follow up for recheck in the next 2 weeks, please establish with a primary care provider.  If you develop worsening of pain, nausea, vomiting, diarrhea, fevers, blood in urine, pain or frequency with urination please return or go to Er.     ED Prescriptions    Medication Sig Dispense Auth. Provider   psyllium (METAMUCIL) 58.6 % packet Take 1 packet by  mouth daily. 30 each Georgetta Haber, NP     Controlled Substance Prescriptions [place] Controlled Substance Registry consulted? Not Applicable   Georgetta Haber, NP 10/01/17 1550

## 2017-10-02 ENCOUNTER — Telehealth (HOSPITAL_COMMUNITY): Payer: Self-pay

## 2017-10-02 LAB — URINE CULTURE: Culture: NO GROWTH

## 2017-10-02 LAB — URINE CYTOLOGY ANCILLARY ONLY
CHLAMYDIA, DNA PROBE: POSITIVE — AB
NEISSERIA GONORRHEA: NEGATIVE
Trichomonas: NEGATIVE

## 2017-10-02 MED ORDER — AZITHROMYCIN 250 MG PO TABS: 1000.0000 mg | ORAL_TABLET | Freq: Every day | ORAL | 0 refills | Status: AC

## 2017-10-02 NOTE — Telephone Encounter (Signed)
Chlamydia is positive.  Rx po zithromax 1g #1 dose no refills was sent to the pharmacy of record. Attempted to contact patient. No answer at this time. Voicemail not available. Need to educate pt to please refrain from sexual intercourse for 7 days to give the medicine time to work, sexual partners need to be notified and tested/treated.  Condoms may reduce risk of reinfection.  Recheck or followup with PCP for further evaluation if symptoms are not improving.   [facility] notified

## 2017-10-08 ENCOUNTER — Telehealth (HOSPITAL_COMMUNITY): Payer: Self-pay

## 2017-10-08 MED ORDER — AZITHROMYCIN 250 MG PO TABS: 1000.0000 mg | ORAL_TABLET | Freq: Once | ORAL | 0 refills | Status: AC

## 2017-10-08 NOTE — Telephone Encounter (Signed)
Pt returned call and is picking up prescriptions today.

## 2017-10-09 ENCOUNTER — Emergency Department (HOSPITAL_COMMUNITY)
Admission: EM | Admit: 2017-10-09 | Discharge: 2017-10-09 | Disposition: A | Discharge status: Home / Self Care | Payer: Medicaid Other | Attending: Emergency Medicine | Admitting: Emergency Medicine

## 2017-10-09 ENCOUNTER — Encounter (HOSPITAL_COMMUNITY): Payer: Self-pay | Admitting: Emergency Medicine

## 2017-10-09 DIAGNOSIS — Z5321 Procedure and treatment not carried out due to patient leaving prior to being seen by health care provider: Secondary | ICD-10-CM | POA: Insufficient documentation

## 2017-10-09 DIAGNOSIS — R1032 Left lower quadrant pain: Secondary | ICD-10-CM | POA: Insufficient documentation

## 2017-10-09 NOTE — ED Triage Notes (Signed)
Patient here from home with complaints of left flank pain described as tightness. Reports that he was diagnosed with Chlamydia and received treatment yesterday. "I still have left flank pain". Denies urinary symptoms.

## 2017-10-10 NOTE — ED Notes (Signed)
No answer on follow up call   1115   10/10/17  s Lareen Mullings rn

## 2018-02-12 ENCOUNTER — Emergency Department (HOSPITAL_COMMUNITY)
Admission: EM | Admit: 2018-02-12 | Discharge: 2018-02-12 | Disposition: A | Discharge status: Home / Self Care | Payer: Medicaid Other | Attending: Emergency Medicine | Admitting: Emergency Medicine

## 2018-02-12 ENCOUNTER — Encounter (HOSPITAL_COMMUNITY): Payer: Self-pay

## 2018-02-12 ENCOUNTER — Other Ambulatory Visit: Payer: Self-pay

## 2018-02-12 DIAGNOSIS — I10 Essential (primary) hypertension: Secondary | ICD-10-CM | POA: Insufficient documentation

## 2018-02-12 DIAGNOSIS — Z87891 Personal history of nicotine dependence: Secondary | ICD-10-CM | POA: Insufficient documentation

## 2018-02-12 DIAGNOSIS — Z79899 Other long term (current) drug therapy: Secondary | ICD-10-CM | POA: Insufficient documentation

## 2018-02-12 DIAGNOSIS — R1032 Left lower quadrant pain: Secondary | ICD-10-CM | POA: Insufficient documentation

## 2018-02-12 DIAGNOSIS — R109 Unspecified abdominal pain: Secondary | ICD-10-CM

## 2018-02-12 LAB — URINALYSIS, ROUTINE W REFLEX MICROSCOPIC
Bacteria, UA: NONE SEEN
Bilirubin Urine: NEGATIVE
Glucose, UA: NEGATIVE mg/dL
Hgb urine dipstick: NEGATIVE
KETONES UR: NEGATIVE mg/dL
Nitrite: NEGATIVE
PROTEIN: NEGATIVE mg/dL
Specific Gravity, Urine: 1.023 (ref 1.005–1.030)
pH: 5 (ref 5.0–8.0)

## 2018-02-12 LAB — COMPREHENSIVE METABOLIC PANEL
ALT: 18 U/L (ref 0–44)
ANION GAP: 8 (ref 5–15)
AST: 24 U/L (ref 15–41)
Albumin: 3.4 g/dL — ABNORMAL LOW (ref 3.5–5.0)
Alkaline Phosphatase: 83 U/L (ref 38–126)
BILIRUBIN TOTAL: 0.9 mg/dL (ref 0.3–1.2)
BUN: 8 mg/dL (ref 6–20)
CO2: 26 mmol/L (ref 22–32)
Calcium: 8.8 mg/dL — ABNORMAL LOW (ref 8.9–10.3)
Chloride: 104 mmol/L (ref 98–111)
Creatinine, Ser: 1.21 mg/dL (ref 0.61–1.24)
GFR calc Af Amer: >60 mL/min (ref >60)
GFR calc non Af Amer: >60 mL/min (ref >60)
Glucose, Bld: 101 mg/dL — ABNORMAL HIGH (ref 70–99)
POTASSIUM: 4.3 mmol/L (ref 3.5–5.1)
Sodium: 138 mmol/L (ref 135–145)
TOTAL PROTEIN: 6.5 g/dL (ref 6.5–8.1)

## 2018-02-12 LAB — CBC
HEMATOCRIT: 46.2 % (ref 39.0–52.0)
HEMOGLOBIN: 14.5 g/dL (ref 13.0–17.0)
MCH: 29.1 pg (ref 26.0–34.0)
MCHC: 31.4 g/dL (ref 30.0–36.0)
MCV: 92.6 fL (ref 78.0–100.0)
Platelets: 229 10*3/uL (ref 150–400)
RBC: 4.99 MIL/uL (ref 4.22–5.81)
RDW: 13.2 % (ref 11.5–15.5)
WBC: 4 10*3/uL (ref 4.0–10.5)

## 2018-02-12 LAB — LIPASE, BLOOD: Lipase: 31 U/L (ref 11–51)

## 2018-02-12 MED ORDER — STERILE WATER FOR INJECTION IJ SOLN
INTRAMUSCULAR | Status: AC
  Filled 2018-02-12: qty 10

## 2018-02-12 MED ORDER — AZITHROMYCIN 250 MG PO TABS
1000.0000 mg | ORAL_TABLET | Freq: Once | ORAL | Status: AC
  Administered 2018-02-12: 1000 mg via ORAL
  Filled 2018-02-12: qty 4

## 2018-02-12 MED ORDER — CEFTRIAXONE SODIUM 250 MG IJ SOLR
250.0000 mg | Freq: Once | INTRAMUSCULAR | Status: AC
  Administered 2018-02-12: 250 mg via INTRAMUSCULAR
  Filled 2018-02-12: qty 250

## 2018-02-12 NOTE — Discharge Instructions (Signed)
A culture was sent of your urine today to determine if there is any bacterial growth. If the results of the culture are positive and you require an antibiotic or a change of your prescribed antibiotic you will be contacted by the hospital. If the results are negative you will not be contacted.  You have been tested for chlamydia and gonorrhea.  These results will be available in approximately 3 days and you will be contacted by the hospital if the results are positive. Avoid sexual contact until you are aware of the results, and please inform all sexual partners if you test positive for any of these diseases.  Please follow up with your primary doctor within the next 5-7 days.  If you do not have a primary care provider, information for a healthcare clinic has been provided for you to make arrangements for follow up care. Please return to the ER sooner if you have any new or worsening symptoms, or if you have any of the following symptoms:  Abdominal pain that does not go away.  You have a fever.  You keep throwing up (vomiting).  The pain is felt only in portions of the abdomen. Pain in the right side could possibly be appendicitis. In an adult, pain in the left lower portion of the abdomen could be colitis or diverticulitis.  You pass bloody or black tarry stools.  There is bright red blood in the stool.  The constipation stays for more than 4 days.  There is belly (abdominal) or rectal pain.  You do not seem to be getting better.  You have any questions or concerns.

## 2018-02-12 NOTE — ED Notes (Signed)
Pt informed he needs to provide a UA when possible. Pt given UA cup / verbalized understanding.

## 2018-02-12 NOTE — ED Provider Notes (Addendum)
[facility] EMERGENCY DEPARTMENT Provider Note   CSN: [id number] Arrival date & time: 02/12/18  1028     History   Chief Complaint Chief Complaint  Patient presents with  . Abdominal Pain    HPI Eric Kent is a 37 y.o. male.  HPI   Pt is a 37 y/o male with ah /o GERD, HTN, who presents to the ED today c/o LLQ pain that has been ongoing for the last 3 months. Pain is constant. Rates it at 6/10. Pain intermittently radiates to left flank. Has tried tylenol and motrin with no relief. Denies fevers, chills, NVD, constipation, dysuria, frequency, urgency, hematuria. Pt denies penile discharge, testicular pain/swelling/redness, or concern for STD. Sttates he has tried taking prune juice for his sxs.  Pt states he has never been evaluated by a practitioner for this sx. According to prior records pt has been evaluated for similar pain multiple times over the course of the last 9 months. He was referred to ID.   Past Medical History:  Diagnosis Date  . GERD (gastroesophageal reflux disease)   . Hypertension   . Ulcer     There are no active problems to display for this patient.   Past Surgical History:  Procedure Laterality Date  . TONSILLECTOMY          Home Medications    Prior to Admission medications   Medication Sig Start Date End Date Taking? Authorizing Provider  miconazole (MICOTIN) 2 % cream Apply 1 application topically 2 (two) times daily. 06/02/16   Joy, Shawn C, PA-C  psyllium (METAMUCIL) 58.6 % packet Take 1 packet by mouth daily. 10/01/17   Georgetta Haber, NP    Family History History reviewed. No pertinent family history.  Social History Social History   Tobacco Use  . Smoking status: Former Smoker    Last attempt to quit: 09/16/2011    Years since quitting: 6.4  . Smokeless tobacco: Never Used  Substance Use Topics  . Alcohol use: Yes    Comment: occ  . Drug use: No     Allergies   Penicillins   Review of  Systems Review of Systems  Constitutional: Negative for chills and fever.  HENT: Negative for ear pain and sore throat.   Eyes: Negative for pain and visual disturbance.  Respiratory: Negative for cough and shortness of breath.   Cardiovascular: Negative for chest pain and palpitations.  Gastrointestinal: Positive for abdominal pain. Negative for blood in stool, constipation, diarrhea, nausea and vomiting.  Genitourinary: Positive for flank pain. Negative for dysuria and hematuria.  Musculoskeletal: Negative for back pain and neck pain.  Skin: Negative for rash.  Neurological: Negative for headaches.  All other systems reviewed and are negative.    Physical Exam Updated Vital Signs BP (!) 148/92 (BP Location: Right Arm)   Pulse 75   Temp 98.7 F (37.1 C) (Oral)   Resp 17   Ht 6' (1.829 m)   Wt 111.1 kg   SpO2 100%   BMI 33.23 kg/m   Physical Exam  Constitutional: He appears well-developed and well-nourished.  Non-toxic appearance. He does not appear ill. No distress.  Falling asleep during exam  HENT:  Head: Normocephalic and atraumatic.  Eyes: Conjunctivae are normal.  Neck: Neck supple.  Cardiovascular: Normal rate, regular rhythm and normal heart sounds.  No murmur heard. Pulmonary/Chest: Effort normal and breath sounds normal. No stridor. No respiratory distress. He has no wheezes. He has no rales.  Abdominal: Soft. Bowel  sounds are normal. There is no tenderness. There is no rigidity, no rebound, no guarding, no CVA tenderness and no tenderness at McBurney's point.  Musculoskeletal: He exhibits no edema.  Neurological: He is alert.  Skin: Skin is warm and dry. Capillary refill takes less than 2 seconds.  Psychiatric: He has a normal mood and affect.  Nursing note and vitals reviewed.   ED Treatments / Results  Labs (all labs ordered are listed, but only abnormal results are displayed) Labs Reviewed  COMPREHENSIVE METABOLIC PANEL - Abnormal; Notable for the  following components:      Result Value   Glucose, Bld 101 (*)    Calcium 8.8 (*)    Albumin 3.4 (*)    All other components within normal limits  URINALYSIS, ROUTINE W REFLEX MICROSCOPIC - Abnormal; Notable for the following components:   Leukocytes, UA TRACE (*)    All other components within normal limits  URINE CULTURE  LIPASE, BLOOD  CBC  GC/CHLAMYDIA PROBE AMP ([place]) [facility]    EKG None  Radiology No results found.  Procedures Procedures (including critical care time)  Medications Ordered in ED Medications  cefTRIAXone (ROCEPHIN) injection 250 mg (has no administration in time range)  azithromycin (ZITHROMAX) tablet 1,000 mg (has no administration in time range)     Initial Impression / Assessment and Plan / ED Course  I have reviewed the triage vital signs and the nursing notes.  Pertinent labs & imaging results that were available during my care of the patient were reviewed by me and considered in my medical decision making (see chart for details).     Final Clinical Impressions(s) / ED Diagnoses   Final diagnoses:  Abdominal pain, unspecified abdominal location   Patient presented with left lower quadrant abdominal pain that has been ongoing intermittently x3 months per the patient.  On chart review it appears patient has presented with similar complaints starting back in January of this year.  He has been frequently diagnosed with gonorrhea and/or chlamydia during those visits.  Today he initially denied any risk factors for STDs however upon reevaluation he states "there is a small chance" of having a sexually transmitted disease.  UA shows sterile pyuria, have higher suspicion for STD this therefore we will treat with ceftriaxone and azithromycin and send urine for culture.  He has no symptoms of a UTI at this time therefore we will hold treatment until culture results.  His abdominal exam is benign he has no tenderness to the abdomen, specifically no  TTP to the left lower quadrant where he states he has pain. no CVA tenderness.  Lower suspicion for nephrolithiasis or other intra-abdominal pathology that would require further imaging or testing.  Patient is nontoxic and nonseptic appearing.  Is in no acute distress.  His lab work is benign.  No leukocytosis, no anemia, normal kidney and liver function.  Normal lecture lites.  Normal lipase.  Patient advised on safe sex practices.  Will give information follow-up with PCP and to return to the ER for any new or worsening symptoms develop.  Patient voices understanding the plan and reasons to return to the ED.  All questions answered.  ED Discharge Orders    None       Karrie MeresCouture, Cayci Mcnabb S, PA-C 02/12/18 [address]1355    Jaidon Ellery S, PA-C 02/12/18 1355    Melene PlanFloyd, Dan, DO 02/12/18 2220

## 2018-02-12 NOTE — ED Triage Notes (Signed)
Pt states he has had abdominal pain X3 months. States severity changes frequently. Pt denies vomiting.

## 2018-02-13 LAB — GC/CHLAMYDIA PROBE AMP (~~LOC~~) NOT AT ARMC
Chlamydia: NEGATIVE
Neisseria Gonorrhea: POSITIVE — AB

## 2018-02-13 LAB — URINE CULTURE: Culture: <10000 — AB

## 2018-04-08 ENCOUNTER — Encounter (HOSPITAL_COMMUNITY): Payer: Self-pay | Admitting: Emergency Medicine

## 2018-04-08 ENCOUNTER — Other Ambulatory Visit: Payer: Self-pay

## 2018-04-08 ENCOUNTER — Emergency Department (HOSPITAL_COMMUNITY)
Admission: EM | Admit: 2018-04-08 | Discharge: 2018-04-08 | Disposition: A | Discharge status: Home / Self Care | Payer: Self-pay | Attending: Emergency Medicine | Admitting: Emergency Medicine

## 2018-04-08 ENCOUNTER — Emergency Department (HOSPITAL_COMMUNITY): Payer: Self-pay

## 2018-04-08 DIAGNOSIS — J209 Acute bronchitis, unspecified: Secondary | ICD-10-CM | POA: Insufficient documentation

## 2018-04-08 DIAGNOSIS — Z87891 Personal history of nicotine dependence: Secondary | ICD-10-CM | POA: Insufficient documentation

## 2018-04-08 DIAGNOSIS — I10 Essential (primary) hypertension: Secondary | ICD-10-CM | POA: Insufficient documentation

## 2018-04-08 DIAGNOSIS — Z79899 Other long term (current) drug therapy: Secondary | ICD-10-CM | POA: Insufficient documentation

## 2018-04-08 MED ORDER — AZITHROMYCIN 250 MG PO TABS: 250.0000 mg | ORAL_TABLET | Freq: Every day | ORAL | 0 refills | Status: DC

## 2018-04-08 NOTE — ED Provider Notes (Signed)
  [facility] EMERGENCY DEPARTMENT Provider Note   CSN: [id number] Arrival date & time: 04/08/18  1510     History   Chief Complaint Chief Complaint  Patient presents with  . Cough    HPI Karren BurlyMichael Birt is a 37 y.o. male who presents to the ED with cough and congestion. Symptoms started 2 weeks ago. Patient reports that he is an every day smoker. His cough has been productive with yellow sputum.   HPI  Past Medical History:  Diagnosis Date  . GERD (gastroesophageal reflux disease)   . Hypertension   . Ulcer     There are no active problems to display for this patient.   Past Surgical History:  Procedure Laterality Date  . TONSILLECTOMY          Home Medications    Prior to Admission medications   Medication Sig Start Date End Date Taking? Authorizing Provider  miconazole (MICOTIN) 2 % cream Apply 1 application topically 2 (two) times daily. 06/02/16   Joy, Shawn C, PA-C  psyllium (METAMUCIL) 58.6 % packet Take 1 packet by mouth daily. 10/01/17   Georgetta HaberBurky, Natalie B, NP    Family History No family history on file.  Social History Social History   Tobacco Use  . Smoking status: Former Smoker    Last attempt to quit: 09/16/2011    Years since quitting: 6.5  . Smokeless tobacco: Never Used  Substance Use Topics  . Alcohol use: Yes    Comment: occ  . Drug use: No     Allergies   Penicillins   Review of Systems Review of Systems  HENT: Positive for congestion.   Respiratory: Positive for cough.   Musculoskeletal: Positive for myalgias.  All other systems reviewed and are negative.    Physical Exam Updated Vital Signs There were no vitals taken for this visit.  Physical Exam  Constitutional: He appears well-developed and well-nourished. No distress.  HENT:  Head: Normocephalic.  Right Ear: Tympanic membrane normal.  Left Ear: Tympanic membrane normal.  Nose: Rhinorrhea present.  Mouth/Throat: Uvula is midline and mucous  membranes are normal. Posterior oropharyngeal erythema: mild.  Eyes: Conjunctivae and EOM are normal.  Neck: Normal range of motion. Neck supple.  Cardiovascular: Normal rate and regular rhythm.  Pulmonary/Chest: Effort normal. No respiratory distress. He has decreased breath sounds. He has no wheezes. He has no rhonchi. He has no rales.  Musculoskeletal: Normal range of motion.  Neurological: He is alert.  Skin: Skin is warm and dry.  Psychiatric: He has a normal mood and affect. His behavior is normal.  Nursing note and vitals reviewed.    ED Treatments / Results  Labs (all labs ordered are listed, but only abnormal results are displayed) Labs Reviewed - No data to display  EKG None  Radiology No results found.  Procedures Procedures (including critical care time)  Medications Ordered in ED Medications - No data to display   Initial Impression / Assessment and Plan / ED Course  I have reviewed the triage vital signs and the nursing notes.   Final Clinical Impressions(s) / ED Diagnoses  Patient awaiting Chest x-ray. Care turned over to Langston MaskerKaren Sofia, [facility] at 3:55 pm  ED Discharge Orders    None       Kerrie Buffaloeese,  BlissM, [place]NP 04/08/18 1555    Eber HongMiller, Brian, MD 04/09/18 1200

## 2018-04-08 NOTE — ED Triage Notes (Signed)
Per pt he has been having a cough, congestion,  runny nose no fever, for 2 weeks now. Pt is alert oriented x 4 no pain.

## 2018-04-08 NOTE — ED Provider Notes (Signed)
MDM  Chest xray reviewed and discussed with pt.  Pt given rx for zithromax.   Pt advised to return if symptoms worsen or change.    Elson AreasSofia, Allianna Beaubien K, [place]PA-C 04/08/18 [place]    Eber HongMiller, Brian, MD 04/09/18 1200

## 2018-04-08 NOTE — Discharge Instructions (Signed)
Return if any problems.  See your Physician for recheck in 1 week   °
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
## 2018-08-10 ENCOUNTER — Other Ambulatory Visit: Payer: Self-pay

## 2018-08-10 ENCOUNTER — Emergency Department (HOSPITAL_COMMUNITY): Payer: Medicaid Other

## 2018-08-10 ENCOUNTER — Emergency Department (HOSPITAL_COMMUNITY)
Admission: EM | Admit: 2018-08-10 | Discharge: 2018-08-10 | Disposition: A | Discharge status: Home / Self Care | Payer: Medicaid Other | Attending: Emergency Medicine | Admitting: Emergency Medicine

## 2018-08-10 DIAGNOSIS — Z79899 Other long term (current) drug therapy: Secondary | ICD-10-CM | POA: Insufficient documentation

## 2018-08-10 DIAGNOSIS — R109 Unspecified abdominal pain: Secondary | ICD-10-CM | POA: Insufficient documentation

## 2018-08-10 DIAGNOSIS — I10 Essential (primary) hypertension: Secondary | ICD-10-CM | POA: Insufficient documentation

## 2018-08-10 DIAGNOSIS — Z87891 Personal history of nicotine dependence: Secondary | ICD-10-CM | POA: Insufficient documentation

## 2018-08-10 LAB — CBC WITH DIFFERENTIAL/PLATELET
Abs Immature Granulocytes: 0 10*3/uL (ref 0.00–0.07)
BASOS PCT: 1 %
Basophils Absolute: 0 10*3/uL (ref 0.0–0.1)
EOS ABS: 0.1 10*3/uL (ref 0.0–0.5)
Eosinophils Relative: 2 %
HEMATOCRIT: 50 % (ref 39.0–52.0)
Hemoglobin: 15.6 g/dL (ref 13.0–17.0)
Immature Granulocytes: 0 %
LYMPHS ABS: 1.5 10*3/uL (ref 0.7–4.0)
Lymphocytes Relative: 32 %
MCH: 27.8 pg (ref 26.0–34.0)
MCHC: 31.2 g/dL (ref 30.0–36.0)
MCV: 89 fL (ref 80.0–100.0)
MONO ABS: 0.5 10*3/uL (ref 0.1–1.0)
MONOS PCT: 10 %
Neutro Abs: 2.6 10*3/uL (ref 1.7–7.7)
Neutrophils Relative %: 55 %
PLATELETS: 265 10*3/uL (ref 150–400)
RBC: 5.62 MIL/uL (ref 4.22–5.81)
RDW: 13.2 % (ref 11.5–15.5)
WBC: 4.7 10*3/uL (ref 4.0–10.5)
nRBC: 0 % (ref 0.0–0.2)

## 2018-08-10 LAB — URINALYSIS, ROUTINE W REFLEX MICROSCOPIC
Bilirubin Urine: NEGATIVE
GLUCOSE, UA: NEGATIVE mg/dL
Hgb urine dipstick: NEGATIVE
Ketones, ur: NEGATIVE mg/dL
LEUKOCYTE UA: NEGATIVE
NITRITE: NEGATIVE
PH: 6 (ref 5.0–8.0)
PROTEIN: NEGATIVE mg/dL
Specific Gravity, Urine: 1.002 — ABNORMAL LOW (ref 1.005–1.030)

## 2018-08-10 LAB — LIPASE, BLOOD: Lipase: 27 U/L (ref 11–51)

## 2018-08-10 LAB — COMPREHENSIVE METABOLIC PANEL
ALBUMIN: 3.6 g/dL (ref 3.5–5.0)
ALT: 26 U/L (ref 0–44)
AST: 25 U/L (ref 15–41)
Alkaline Phosphatase: 73 U/L (ref 38–126)
Anion gap: 8 (ref 5–15)
BUN: 8 mg/dL (ref 6–20)
CHLORIDE: 104 mmol/L (ref 98–111)
CO2: 25 mmol/L (ref 22–32)
Calcium: 9.5 mg/dL (ref 8.9–10.3)
Creatinine, Ser: 1.45 mg/dL — ABNORMAL HIGH (ref 0.61–1.24)
GFR calc Af Amer: >60 mL/min (ref >60)
GFR calc non Af Amer: >60 mL/min (ref >60)
GLUCOSE: 96 mg/dL (ref 70–99)
POTASSIUM: 3.6 mmol/L (ref 3.5–5.1)
SODIUM: 137 mmol/L (ref 135–145)
Total Bilirubin: 0.9 mg/dL (ref 0.3–1.2)
Total Protein: 6.5 g/dL (ref 6.5–8.1)

## 2018-08-10 MED ORDER — PANTOPRAZOLE SODIUM 20 MG PO TBEC: 20.0000 mg | DELAYED_RELEASE_TABLET | Freq: Every day | ORAL | 0 refills | Status: DC

## 2018-08-10 MED ORDER — KETOROLAC TROMETHAMINE 30 MG/ML IJ SOLN
30.0000 mg | Freq: Once | INTRAMUSCULAR | Status: DC
  Filled 2018-08-10: qty 1

## 2018-08-10 MED ORDER — SODIUM CHLORIDE 0.9 % IV BOLUS: 1000.0000 mL | Freq: Once | INTRAVENOUS | Status: DC

## 2018-08-10 MED ORDER — DIAZEPAM 5 MG PO TABS: 5.0000 mg | ORAL_TABLET | Freq: Two times a day (BID) | ORAL | 0 refills | Status: DC | PRN

## 2018-08-10 NOTE — ED Triage Notes (Addendum)
Pt c/o left abd pain, left side flank pain and left lower back pain ; denies any n/v/d ; pt states it hurts worse with movement and palpitation

## 2018-08-10 NOTE — ED Provider Notes (Signed)
[facility] EMERGENCY DEPARTMENT Provider Note   CSN: [id number] Arrival date & time: 08/10/18  5784    History   Chief Complaint Chief Complaint  Patient presents with  . Abdominal Pain    HPI [facility] is a 38 y.o. male.     Pt presents to the ED today with left flank pain and LLQ pain.  Pt said it's been going on intermittently for months.  He denies any n/v.  He denies any urinary sx.  No penile d/c.  No f/c.     Past Medical History:  Diagnosis Date  . GERD (gastroesophageal reflux disease)   . Hypertension   . Ulcer     There are no active problems to display for this patient.   Past Surgical History:  Procedure Laterality Date  . TONSILLECTOMY          Home Medications    Prior to Admission medications   Medication Sig Start Date End Date Taking? Authorizing Provider  calcium carbonate (TUMS - DOSED IN MG ELEMENTAL CALCIUM) 500 MG chewable tablet Chew 4 tablets by mouth daily as needed for indigestion or heartburn.   Yes [provider]  CRANBERRY PO Take 1 tablet by mouth 2 (two) times daily.   Yes [provider]  azithromycin (ZITHROMAX) 250 MG tablet Take 1 tablet (250 mg total) by mouth daily. Take first 2 tablets together, then 1 every day until finished. Patient not taking: Reported on 08/10/2018 04/08/18   Elson Areas, PA-C  diazepam (VALIUM) 5 MG tablet Take 1 tablet (5 mg total) by mouth every 12 (twelve) hours as needed for muscle spasms. 08/10/18   Jacalyn Lefevre, MD  miconazole (MICOTIN) 2 % cream Apply 1 application topically 2 (two) times daily. Patient not taking: Reported on 08/10/2018 06/02/16   Joy, Ines Bloomer C, PA-C  pantoprazole (PROTONIX) 20 MG tablet Take 1 tablet (20 mg total) by mouth daily. 08/10/18   Jacalyn Lefevre, MD  psyllium (METAMUCIL) 58.6 % packet Take 1 packet by mouth daily. Patient not taking: Reported on 08/10/2018 10/01/17   Georgetta Haber, NP    Family History No family  history on file.  Social History Social History   Tobacco Use  . Smoking status: Former Smoker    Last attempt to quit: 09/16/2011    Years since quitting: 6.9  . Smokeless tobacco: Never Used  Substance Use Topics  . Alcohol use: Yes    Comment: occ  . Drug use: No     Allergies   Penicillins   Review of Systems Review of Systems  Gastrointestinal: Positive for abdominal pain.  Genitourinary: Positive for flank pain.  All other systems reviewed and are negative.    Physical Exam Updated Vital Signs BP (!) 140/94   Pulse 99   Temp 98.4 F (36.9 C) (Oral)   Resp 18   Ht 6' (1.829 m)   Wt 99.8 kg   SpO2 98%   BMI 29.84 kg/m   Physical Exam Vitals signs and nursing note reviewed.  Constitutional:      Appearance: He is well-developed.  HENT:     Head: Normocephalic and atraumatic.     Mouth/Throat:     Mouth: Mucous membranes are moist.     Pharynx: Oropharynx is clear.  Eyes:     Extraocular Movements: Extraocular movements intact.     Pupils: Pupils are equal, round, and reactive to light.  Cardiovascular:     Rate and Rhythm: Normal rate  and regular rhythm.  Pulmonary:     Effort: Pulmonary effort is normal.     Breath sounds: Normal breath sounds.  Abdominal:     General: Abdomen is flat and scaphoid. Bowel sounds are normal.     Palpations: Abdomen is soft.     Tenderness: There is generalized abdominal tenderness.  Genitourinary:    Penis: Normal.      Scrotum/Testes: Normal.  Musculoskeletal:       Arms:  Skin:    General: Skin is warm and dry.     Capillary Refill: Capillary refill takes less than 2 seconds.  Neurological:     General: No focal deficit present.     Mental Status: He is alert and oriented to person, place, and time.  Psychiatric:        Mood and Affect: Mood normal.        Behavior: Behavior normal.      ED Treatments / Results  Labs (all labs ordered are listed, but only abnormal results are displayed) Labs  Reviewed  COMPREHENSIVE METABOLIC PANEL - Abnormal; Notable for the following components:      Result Value   Creatinine, Ser 1.45 (*)    All other components within normal limits  URINALYSIS, ROUTINE W REFLEX MICROSCOPIC - Abnormal; Notable for the following components:   Color, Urine STRAW (*)    Specific Gravity, Urine 1.002 (*)    All other components within normal limits  CBC WITH DIFFERENTIAL/PLATELET  LIPASE, BLOOD  GC/CHLAMYDIA PROBE AMP ([place]) NOT AT [facility]    EKG None  Radiology Ct Renal Stone Study  Result Date: 08/10/2018 CLINICAL DATA:  38 year old male with history of ongoing flank pain on the left side for the past 2 months. EXAM: CT ABDOMEN AND PELVIS WITHOUT CONTRAST TECHNIQUE: Multidetector CT imaging of the abdomen and pelvis was performed following the standard protocol without IV contrast. COMPARISON:  No priors. FINDINGS: Lower chest: Small left-sided diaphragmatic hernia containing fat extending cephalad into the inferior aspect of the left major fissure. Hepatobiliary: No definite suspicious cystic or solid hepatic lesions are confidently identified on today's noncontrast CT examination. Unenhanced appearance of the gallbladder is normal. Pancreas: No definite pancreatic mass or peripancreatic fluid or inflammatory changes are noted on today's noncontrast CT examination. Spleen: Unremarkable. Adrenals/Urinary Tract: There are no abnormal calcifications within the collecting system of either kidney, along the course of either ureter, or within the lumen of the urinary bladder. No hydroureteronephrosis or perinephric stranding to suggest urinary tract obstruction at this time. The unenhanced appearance of the kidneys is unremarkable bilaterally. Unenhanced appearance of the urinary bladder is normal. Bilateral adrenal glands are normal in appearance. Stomach/Bowel: Unenhanced appearance of the stomach is normal. No pathologic dilatation of small bowel or colon. Normal  appendix. Vascular/Lymphatic: No atherosclerotic calcifications in the abdominal aorta or pelvic vasculature. No lymphadenopathy noted in the abdomen or pelvis. Reproductive: Prostate gland and seminal vesicles are unremarkable in appearance. Other: No significant volume of ascites.  No pneumoperitoneum. Musculoskeletal: There are no aggressive appearing lytic or blastic lesions noted in the visualized portions of the skeleton. IMPRESSION: 1. No acute findings are noted in the abdomen or pelvis to account for the patient's symptoms. 2. 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 This is likely a chronic finding, as evidence of this is present in retrospect on prior chest x-ray 04/08/2018. Electronically Signed  By: Trudie Reed M.D.   On: 08/10/2018 10:32    Procedures Procedures (including critical care time)  Medications Ordered in ED Medications  sodium chloride 0.9 % bolus 1,000 mL (1,000 mLs Intravenous Refused 08/10/18 0934)  ketorolac (TORADOL) 30 MG/ML injection 30 mg (30 mg Intravenous Not Given 08/10/18 0934)     Initial Impression / Assessment and Plan / ED Course  I have reviewed the triage vital signs and the nursing notes.  Pertinent labs & imaging results that were available during my care of the patient were reviewed by me and considered in my medical decision making (see chart for details).       Pt did not want any IVFs or toradol.  He said when he belches the pain goes away.  He said he has a hx of GERD and was on a medication in [place] which helped, but he does not know the name.  He also said he has a lot of diarrhea.  It is likely that his sx are all GI related, so he is told to f/u with GI.  He also seems to have some muscle spasms, so is d/c on valium.  He is encouraged to eat extra fiber and increase the fruits and vegetables in diet.  Return if worse.   Final Clinical Impressions(s) / ED Diagnoses   Final diagnoses:  Flank pain    ED Discharge Orders         Ordered    pantoprazole (PROTONIX) 20 MG tablet  Daily     08/10/18 1108    diazepam (VALIUM) 5 MG tablet  Every 12 hours PRN     08/10/18 1108           Jacalyn Lefevre, MD 08/10/18 1110

## 2018-08-10 NOTE — ED Notes (Addendum)
Pt refusing IV, Toradol , and Iv fluids; Dr. Particia Nearing aware

## 2018-08-10 NOTE — ED Notes (Signed)
Patient verbalizes understanding of discharge instructions. Opportunity for questioning and answering were provided. Armband removed by staff , patient discharged from ED. 

## 2018-08-12 LAB — GC/CHLAMYDIA PROBE AMP (~~LOC~~) NOT AT ARMC
CHLAMYDIA, DNA PROBE: NEGATIVE
NEISSERIA GONORRHEA: NEGATIVE

## 2019-04-10 ENCOUNTER — Other Ambulatory Visit: Payer: Self-pay

## 2019-04-10 ENCOUNTER — Encounter (HOSPITAL_COMMUNITY): Payer: Self-pay

## 2019-04-10 ENCOUNTER — Ambulatory Visit (HOSPITAL_COMMUNITY)
Admission: EM | Admit: 2019-04-10 | Discharge: 2019-04-10 | Disposition: A | Discharge status: Home / Self Care | Payer: Self-pay | Attending: Internal Medicine | Admitting: Internal Medicine

## 2019-04-10 DIAGNOSIS — M7918 Myalgia, other site: Secondary | ICD-10-CM

## 2019-04-10 LAB — POCT URINALYSIS DIP (DEVICE)
Bilirubin Urine: NEGATIVE
Glucose, UA: NEGATIVE mg/dL
Hgb urine dipstick: NEGATIVE
Leukocytes,Ua: NEGATIVE
Nitrite: NEGATIVE
Protein, ur: NEGATIVE mg/dL
Specific Gravity, Urine: 1.025 (ref 1.005–1.030)
Urobilinogen, UA: 4 mg/dL — ABNORMAL HIGH (ref 0.0–1.0)
pH: 7.5 (ref 5.0–8.0)

## 2019-04-10 MED ORDER — CYCLOBENZAPRINE HCL 10 MG PO TABS: 10.0000 mg | ORAL_TABLET | Freq: Two times a day (BID) | ORAL | 0 refills | Status: DC | PRN

## 2019-04-10 NOTE — ED Triage Notes (Signed)
Pt states he has left side flank pain and back pain. Pt has been trying tylenol and it's not working. X 2 weeks or more.

## 2019-04-10 NOTE — ED Provider Notes (Signed)
[facility]    CSN: [id number] Arrival date & time: 04/10/19  1204      History   Chief Complaint Chief Complaint  Patient presents with  . Flank Pain    HPI Eric Kent is a 38 y.o. male with a history of gastroesophageal reflux disease-controlled comes to urgent care with complaint of  left-sided lower back pain of 2 weeks duration.  Pain started 2 weeks ago and has been persistent.  It is aggravated by movement.  It is not improved with Tylenol use.  He denies any dysuria, urgency or frequency.  No fever or chills.  No nausea or vomiting.  No numbness or tingling in the lower extremities.  No weight loss.  No radiation of pain.  No heavy lifting or trauma to his back.  HPI  Past Medical History:  Diagnosis Date  . GERD (gastroesophageal reflux disease)   . Hypertension   . Ulcer     There are no active problems to display for this patient.   Past Surgical History:  Procedure Laterality Date  . TONSILLECTOMY         Home Medications    Prior to Admission medications   Medication Sig Start Date End Date Taking? Authorizing Provider  calcium carbonate (TUMS - DOSED IN MG ELEMENTAL CALCIUM) 500 MG chewable tablet Chew 4 tablets by mouth daily as needed for indigestion or heartburn.    [provider]  CRANBERRY PO Take 1 tablet by mouth 2 (two) times daily.    [provider]  cyclobenzaprine (FLEXERIL) 10 MG tablet Take 1 tablet (10 mg total) by mouth 2 (two) times daily as needed for muscle spasms. 04/10/19   Syerra Abdelrahman, Britta Mccreedy, MD  diazepam (VALIUM) 5 MG tablet Take 1 tablet (5 mg total) by mouth every 12 (twelve) hours as needed for muscle spasms. 08/10/18   Jacalyn Lefevre, MD  pantoprazole (PROTONIX) 20 MG tablet Take 1 tablet (20 mg total) by mouth daily. 08/10/18   Jacalyn Lefevre, MD    Family History No family history on file.  Social History Social History   Tobacco Use  . Smoking status: Former Smoker    Quit date:  09/16/2011    Years since quitting: 7.5  . Smokeless tobacco: Never Used  Substance Use Topics  . Alcohol use: Yes    Comment: occ  . Drug use: No     Allergies   Penicillins   Review of Systems Review of Systems  Constitutional: Negative for activity change and fatigue.  Respiratory: Negative.   Cardiovascular: Negative.   Gastrointestinal: Negative for nausea and vomiting.  Genitourinary: Negative for dysuria, frequency, hematuria and urgency.  Musculoskeletal: Positive for back pain. Negative for gait problem, joint swelling and myalgias.  Skin: Negative for rash and wound.  Neurological: Negative.  Negative for dizziness, numbness and headaches.     Physical Exam Triage Vital Signs ED Triage Vitals  Enc Vitals Group     BP 04/10/19 1230 (!) 146/76     Pulse Rate 04/10/19 1230 72     Resp 04/10/19 1230 18     Temp 04/10/19 1230 98.4 F (36.9 C)     Temp Source 04/10/19 1230 Oral     SpO2 04/10/19 1230 97 %     Weight 04/10/19 1231 230 lb (104.3 kg)     Height --      Head Circumference --      Peak Flow --      Pain Score 04/10/19  1231 7     Pain Loc --      Pain Edu? --      Excl. in GC? --    No data found.  Updated Vital Signs BP (!) 146/76 (BP Location: Right Arm)   Pulse 72   Temp 98.4 F (36.9 C) (Oral)   Resp 18   Wt 104.3 kg   SpO2 97%   BMI 31.19 kg/m   Visual Acuity Right Eye Distance:   Left Eye Distance:   Bilateral Distance:    Right Eye Near:   Left Eye Near:    Bilateral Near:     Physical Exam Constitutional:      General: He is not in acute distress.    Appearance: Normal appearance. He is not ill-appearing.  Cardiovascular:     Rate and Rhythm: Normal rate and regular rhythm.     Pulses: Normal pulses.     Heart sounds: Normal heart sounds. No murmur. No gallop.   Pulmonary:     Effort: Pulmonary effort is normal. No respiratory distress.     Breath sounds: Normal breath sounds. No wheezing or rhonchi.  Abdominal:      General: Bowel sounds are normal.     Palpations: Abdomen is soft.     Tenderness: There is no abdominal tenderness. There is no guarding or rebound.  Musculoskeletal: Normal range of motion.        General: Tenderness present. No deformity or signs of injury.     Comments: Paraspinal muscle tenderness over the lumbar spine.  Skin:    General: Skin is warm.     Capillary Refill: Capillary refill takes less than 2 seconds.     Findings: No bruising.  Neurological:     General: No focal deficit present.     Mental Status: He is alert and oriented to person, place, and time.     Cranial Nerves: No cranial nerve deficit.     Sensory: No sensory deficit.      UC Treatments / Results  Labs (all labs ordered are listed, but only abnormal results are displayed) Labs Reviewed  POCT URINALYSIS DIP (DEVICE) - Abnormal; Notable for the following components:      Result Value   Ketones, ur TRACE (*)    Urobilinogen, UA 4.0 (*)    All other components within normal limits    EKG   Radiology No results found.  Procedures Procedures (including critical care time)  Medications Ordered in UC Medications - No data to display  Initial Impression / Assessment and Plan / UC Course  I have reviewed the triage vital signs and the nursing notes.  Pertinent labs & imaging results that were available during my care of the patient were reviewed by me and considered in my medical decision making (see chart for details).     1.  Musculoskeletal back pain: Heat therapy Flexeril as needed for muscle spasm Continue Tylenol or Motrin as needed for pain Gentle stretches Back strengthening exercises. If patient has worsening back pain or neurologic signs/symptoms he is advised to return to urgent care to be reevaluated. Final Clinical Impressions(s) / UC Diagnoses   Final diagnoses:  Musculoskeletal pain   Discharge Instructions   None    ED Prescriptions    Medication Sig Dispense  Auth. Provider   cyclobenzaprine (FLEXERIL) 10 MG tablet Take 1 tablet (10 mg total) by mouth 2 (two) times daily as needed for muscle spasms. 20 tablet Brennden Masten, Britta MccreedyPhilip O, MD  PDMP not reviewed this encounter.   Chase Picket, MD 04/12/19 2136

## 2019-11-23 ENCOUNTER — Emergency Department (HOSPITAL_COMMUNITY)
Admission: EM | Admit: 2019-11-23 | Discharge: 2019-11-23 | Disposition: A | Discharge status: Home / Self Care | Payer: Medicaid Other | Attending: Emergency Medicine | Admitting: Emergency Medicine

## 2019-11-23 ENCOUNTER — Encounter (HOSPITAL_COMMUNITY): Payer: Self-pay | Admitting: Emergency Medicine

## 2019-11-23 DIAGNOSIS — Z87891 Personal history of nicotine dependence: Secondary | ICD-10-CM | POA: Insufficient documentation

## 2019-11-23 DIAGNOSIS — Z79899 Other long term (current) drug therapy: Secondary | ICD-10-CM | POA: Insufficient documentation

## 2019-11-23 DIAGNOSIS — R1032 Left lower quadrant pain: Secondary | ICD-10-CM | POA: Insufficient documentation

## 2019-11-23 DIAGNOSIS — I10 Essential (primary) hypertension: Secondary | ICD-10-CM | POA: Insufficient documentation

## 2019-11-23 MED ORDER — DICYCLOMINE HCL 20 MG PO TABS: 20.0000 mg | ORAL_TABLET | Freq: Two times a day (BID) | ORAL | 0 refills | Status: DC

## 2019-11-23 NOTE — Discharge Instructions (Signed)
Take 4 over the counter ibuprofen tablets 3 times a day or 2 over-the-counter naproxen tablets twice a day for pain. Also take tylenol 1000mg (2 extra strength) four times a day.    Try not to do any heavy lifting bending or twisting.  Follow-up with a sports medicine doctor in the office.  With your trouble with your GI tract please follow-up with the gastroenterologist on call.  As we discussed everyone needs a family doctor this is someone who can help keep track of what is going on with you and make sure that they are able to find an answer to hear medical problem.  Please return to the ED for worsening pain fever or inability to eat or drink.  You can try the prescription medicine to see if that helps you with your symptoms.  You can also try to take MiraLAX which is a medicine that you can buy over-the-counter.

## 2019-11-23 NOTE — ED Provider Notes (Signed)
[facility] EMERGENCY DEPARTMENT Provider Note   CSN: [id number] Arrival date & time: 11/23/19  9379     History Chief Complaint  Patient presents with  . Abdominal Pain    Eric Kent is a 39 y.o. male.  39 yo M with a cc of LL abdominal pain.  Going on for at least the past year.  Patient states that this seems occur with certain positions ambulation and if he sits a certain way.  He feels sometimes like he has trouble urinating with this.  Has been seen multiple times in the emergency department and had negative STD testing is been treated like it is musculoskeletal without improvement.  He does feel that he also has excessive flatulence.  States he has tried to eat different diets without much change.  Is also tried Gas-X without improvement.  Has tried NSAIDs and Tylenol off and on without significant improvement.  Had an injury he thinks about a year ago but nothing recently.  Denies dark stool or blood in the stool denies fevers.  The history is provided by the patient.  Abdominal Pain Pain location:  LLQ Pain quality: aching and sharp   Pain radiates to:  Does not radiate Pain severity:  No pain Onset quality:  Sudden Duration:  52 weeks Timing:  Constant Chronicity:  New Relieved by:  Nothing Worsened by:  Nothing Ineffective treatments:  None tried Associated symptoms: no chest pain, no chills, no diarrhea, no fever, no shortness of breath and no vomiting        Past Medical History:  Diagnosis Date  . GERD (gastroesophageal reflux disease)   . Hypertension   . Ulcer     There are no problems to display for this patient.   Past Surgical History:  Procedure Laterality Date  . TONSILLECTOMY         No family history on file.  Social History   Tobacco Use  . Smoking status: Former Smoker    Quit date: 09/16/2011    Years since quitting: 8.1  . Smokeless tobacco: Never Used  Substance Use Topics  . Alcohol use: Yes    Comment:  occ  . Drug use: No    Home Medications Prior to Admission medications   Medication Sig Start Date End Date Taking? Authorizing Provider  calcium carbonate (TUMS - DOSED IN MG ELEMENTAL CALCIUM) 500 MG chewable tablet Chew 4 tablets by mouth daily as needed for indigestion or heartburn.    [provider]  CRANBERRY PO Take 1 tablet by mouth 2 (two) times daily.    [provider]  cyclobenzaprine (FLEXERIL) 10 MG tablet Take 1 tablet (10 mg total) by mouth 2 (two) times daily as needed for muscle spasms. 04/10/19   Lamptey, Britta Mccreedy, MD  diazepam (VALIUM) 5 MG tablet Take 1 tablet (5 mg total) by mouth every 12 (twelve) hours as needed for muscle spasms. 08/10/18   Jacalyn Lefevre, MD  dicyclomine (BENTYL) 20 MG tablet Take 1 tablet (20 mg total) by mouth 2 (two) times daily. 11/23/19   Melene Plan, DO  pantoprazole (PROTONIX) 20 MG tablet Take 1 tablet (20 mg total) by mouth daily. 08/10/18   Jacalyn Lefevre, MD    Allergies    Penicillins  Review of Systems   Review of Systems  Constitutional: Negative for chills and fever.  HENT: Negative for congestion and facial swelling.   Eyes: Negative for discharge and visual disturbance.  Respiratory: Negative for shortness of breath.  Cardiovascular: Negative for chest pain and palpitations.  Gastrointestinal: Positive for abdominal pain. Negative for diarrhea and vomiting.  Musculoskeletal: Negative for arthralgias and myalgias.  Skin: Negative for color change and rash.  Neurological: Negative for tremors, syncope and headaches.  Psychiatric/Behavioral: Negative for confusion and dysphoric mood.    Physical Exam Updated Vital Signs BP (!) 133/98 (BP Location: Right Arm)   Pulse 61   Temp 98.2 F (36.8 C) (Oral)   Resp 20   Ht 6' (1.829 m)   Wt 104.3 kg   SpO2 100%   BMI 31.19 kg/m   Physical Exam Vitals and nursing note reviewed.  Constitutional:      Appearance: He is well-developed.  HENT:     Head:  Normocephalic and atraumatic.  Eyes:     Pupils: Pupils are equal, round, and reactive to light.  Neck:     Vascular: No JVD.  Cardiovascular:     Rate and Rhythm: Normal rate and regular rhythm.     Heart sounds: No murmur heard.  No friction rub. No gallop.   Pulmonary:     Effort: No respiratory distress.     Breath sounds: No wheezing.  Abdominal:     General: There is no distension.     Tenderness: There is no guarding or rebound.  Musculoskeletal:        General: Normal range of motion.     Cervical back: Normal range of motion and neck supple.     Comments: Pain at the attachment of the abdominal musculature to the iliac crest on the left.  Reproduces the patient's pain.  No significant tenderness to palpation of the abdomen.  Full range of motion of the left hip without tenderness.  Pulse motor and sensation intact distally.  Skin:    Coloration: Skin is not pale.     Findings: No rash.  Neurological:     Mental Status: He is alert and oriented to person, place, and time.  Psychiatric:        Behavior: Behavior normal.     ED Results / Procedures / Treatments   Labs (all labs ordered are listed, but only abnormal results are displayed) Labs Reviewed - No data to display  EKG None  Radiology No results found.  Procedures Procedures (including critical care time)  Medications Ordered in ED Medications - No data to display  ED Course  I have reviewed the triage vital signs and the nursing notes.  Pertinent labs & imaging results that were available during my care of the patient were reviewed by me and considered in my medical decision making (see chart for details).    MDM Rules/Calculators/A&P                          39 yo M with a chief complaints of left lower abdominal pain.  This is been off and on for at least a year.  On exam it seems most likely that the patient has muscular strain and assume that he continues to reinjure it at work or with his  normal activities.  It somewhat atypical that he has some bowel complaints as well as some urinary complaints with this.  I suggested with his prolonged time of illness that he should get this followed up as an outpatient.  I feel this is unlikely to be a year-long history of kidney stones, seems less likely to be a urinary tract infection nor an STD as  he is already had testing when he was symptomatic.  I do not feel that CT imaging would be beneficial in this prolonged timeline.  Seems unlikely to be acute diverticulitis.  He has no testicular or penile symptoms.  Will treat as muscular pathology.  Give him follow-up with sports medicine and GI.  PCP follow-up as well.  10:39 AM:  I have discussed the diagnosis/risks/treatment options with the patient and believe the pt to be eligible for discharge home to follow-up with PCP, GI, Sports med. We also discussed returning to the ED immediately if new or worsening sx occur. We discussed the sx which are most concerning (e.g., sudden worsening pain, fever, inability to tolerate by mouth) that necessitate immediate return. Medications administered to the patient during their visit and any new prescriptions provided to the patient are listed below.  Medications given during this visit Medications - No data to display   The patient appears reasonably screen and/or stabilized for discharge and I doubt any other medical condition or other [facility] requiring further screening, evaluation, or treatment in the ED at this time prior to discharge.   Final Clinical Impression(s) / ED Diagnoses Final diagnoses:  Left lower quadrant abdominal pain    Rx / DC Orders ED Discharge Orders         Ordered    dicyclomine (BENTYL) 20 MG tablet  2 times daily     Discontinue  Reprint     11/23/19 0920           Melene Plan, DO 11/23/19 1040

## 2019-11-23 NOTE — ED Triage Notes (Signed)
Pt. Stated, Im having a hard time to pee for a week. I have this pain on the left side.

## 2019-12-01 ENCOUNTER — Encounter: Payer: Self-pay | Admitting: Gastroenterology

## 2019-12-07 ENCOUNTER — Encounter (HOSPITAL_COMMUNITY): Payer: Self-pay

## 2019-12-07 ENCOUNTER — Ambulatory Visit (HOSPITAL_COMMUNITY)
Admission: EM | Admit: 2019-12-07 | Discharge: 2019-12-07 | Disposition: A | Discharge status: Home / Self Care | Payer: Medicaid Other | Attending: Urgent Care | Admitting: Urgent Care

## 2019-12-07 ENCOUNTER — Other Ambulatory Visit: Payer: Self-pay

## 2019-12-07 DIAGNOSIS — K047 Periapical abscess without sinus: Secondary | ICD-10-CM

## 2019-12-07 DIAGNOSIS — K0889 Other specified disorders of teeth and supporting structures: Secondary | ICD-10-CM

## 2019-12-07 MED ORDER — CLINDAMYCIN HCL 300 MG PO CAPS: 300.0000 mg | ORAL_CAPSULE | Freq: Three times a day (TID) | ORAL | 0 refills | Status: AC

## 2019-12-07 MED ORDER — NAPROXEN 500 MG PO TABS: 500.0000 mg | ORAL_TABLET | Freq: Two times a day (BID) | ORAL | 0 refills | Status: DC

## 2019-12-07 NOTE — ED Triage Notes (Signed)
Pt c/o broken tooth x 3 days, woke up today with facial swelling. Taking ibuprofen and tylenol at home with minimal relief

## 2019-12-07 NOTE — Discharge Instructions (Addendum)
[facility] [phone] extension [phone]  Dr. Civils [phone] [address]  [facility] [phone] [address]  [facility] [phone] extension [phone] [place], [place], [address] First come first serve for the first 10 clients.  May do simple extractions only, no wisdom teeth or surgery.  You may try the second for Thursday of the month starting at 6:30 AM.  [employer] You may call the school to see if they are still helping to provide dental care for emergent cases.  

## 2019-12-07 NOTE — ED Provider Notes (Signed)
[facility]   MRN: [id number] DOB: 1980-07-19  Subjective:   Eric Kent is a 39 y.o. male presenting for 3-day history of worsening dental pain, having facial swelling and facial pain today.  Patient states that he was brushing his teeth about 3 days ago when he noticed one of his molars on the left side breakoff.  He has since had worsening symptoms.  He did contact the dental practice and has an appointment set up but his pain is severe today and wanted to get seen.  No current facility-administered medications for this encounter. No current outpatient medications on file.   Allergies  Allergen Reactions  . Penicillins Hives    Did it involve swelling of the face/tongue/throat, SOB, or low BP? NO Did it involve sudden or severe rash/hives, skin peeling, or any reaction on the inside of your mouth or nose? YES Did you need to seek medical attention at a hospital or doctor's office? YES When did it last happen? 8 years ago If all above answers are "NO", may proceed with cephalosporin use.    Past Medical History:  Diagnosis Date  . GERD (gastroesophageal reflux disease)   . Hypertension   . Ulcer      Past Surgical History:  Procedure Laterality Date  . TONSILLECTOMY      History reviewed. No pertinent family history.  Social History   Tobacco Use  . Smoking status: Former Smoker    Quit date: 09/16/2011    Years since quitting: 8.2  . Smokeless tobacco: Never Used  Substance Use Topics  . Alcohol use: Yes    Comment: occ  . Drug use: No    ROS   Objective:   Vitals: BP (!) 157/114   Pulse 72   Temp 99.3 F (37.4 C)   Resp 18   SpO2 99%   Physical Exam Constitutional:      General: He is not in acute distress.    Appearance: Normal appearance. He is well-developed and normal weight. He is not ill-appearing, toxic-appearing or diaphoretic.  HENT:     Head: Normocephalic and atraumatic.      Right Ear: External ear normal.     Left  Ear: External ear normal.     Nose: Nose normal.     Mouth/Throat:     Dentition: Dental caries present.     Pharynx: Oropharynx is clear.   Eyes:     General: No scleral icterus.       Right eye: No discharge.        Left eye: No discharge.     Extraocular Movements: Extraocular movements intact.     Pupils: Pupils are equal, round, and reactive to light.  Cardiovascular:     Rate and Rhythm: Normal rate.  Pulmonary:     Effort: Pulmonary effort is normal.  Musculoskeletal:     Cervical back: Normal range of motion.  Neurological:     Mental Status: He is alert and oriented to person, place, and time.  Psychiatric:        Mood and Affect: Mood normal.        Behavior: Behavior normal.        Thought Content: Thought content normal.        Judgment: Judgment normal.      Assessment and Plan :   PDMP not reviewed this encounter.  1. Dental infection   2. Dental abscess   3. Pain, dental     Patient is allergic to  amoxicillin and therefore will use clindamycin.  Emphasized need for close follow-up with his dentist. Naproxen for pain and inflammation. Counseled patient on potential for adverse effects with medications prescribed/recommended today, ER and return-to-clinic precautions discussed, patient verbalized understanding.    Wallis Bamberg, PA-C 12/07/19 1106

## 2019-12-10 IMAGING — DX DG CHEST 2V
2 series · 2 of 2 positions shown · non-contrast
Comparison: None.

CLINICAL DATA: Cough and shortness of breath for 2 weeks.

EXAM:
CHEST - 2 VIEW

[chest pa]
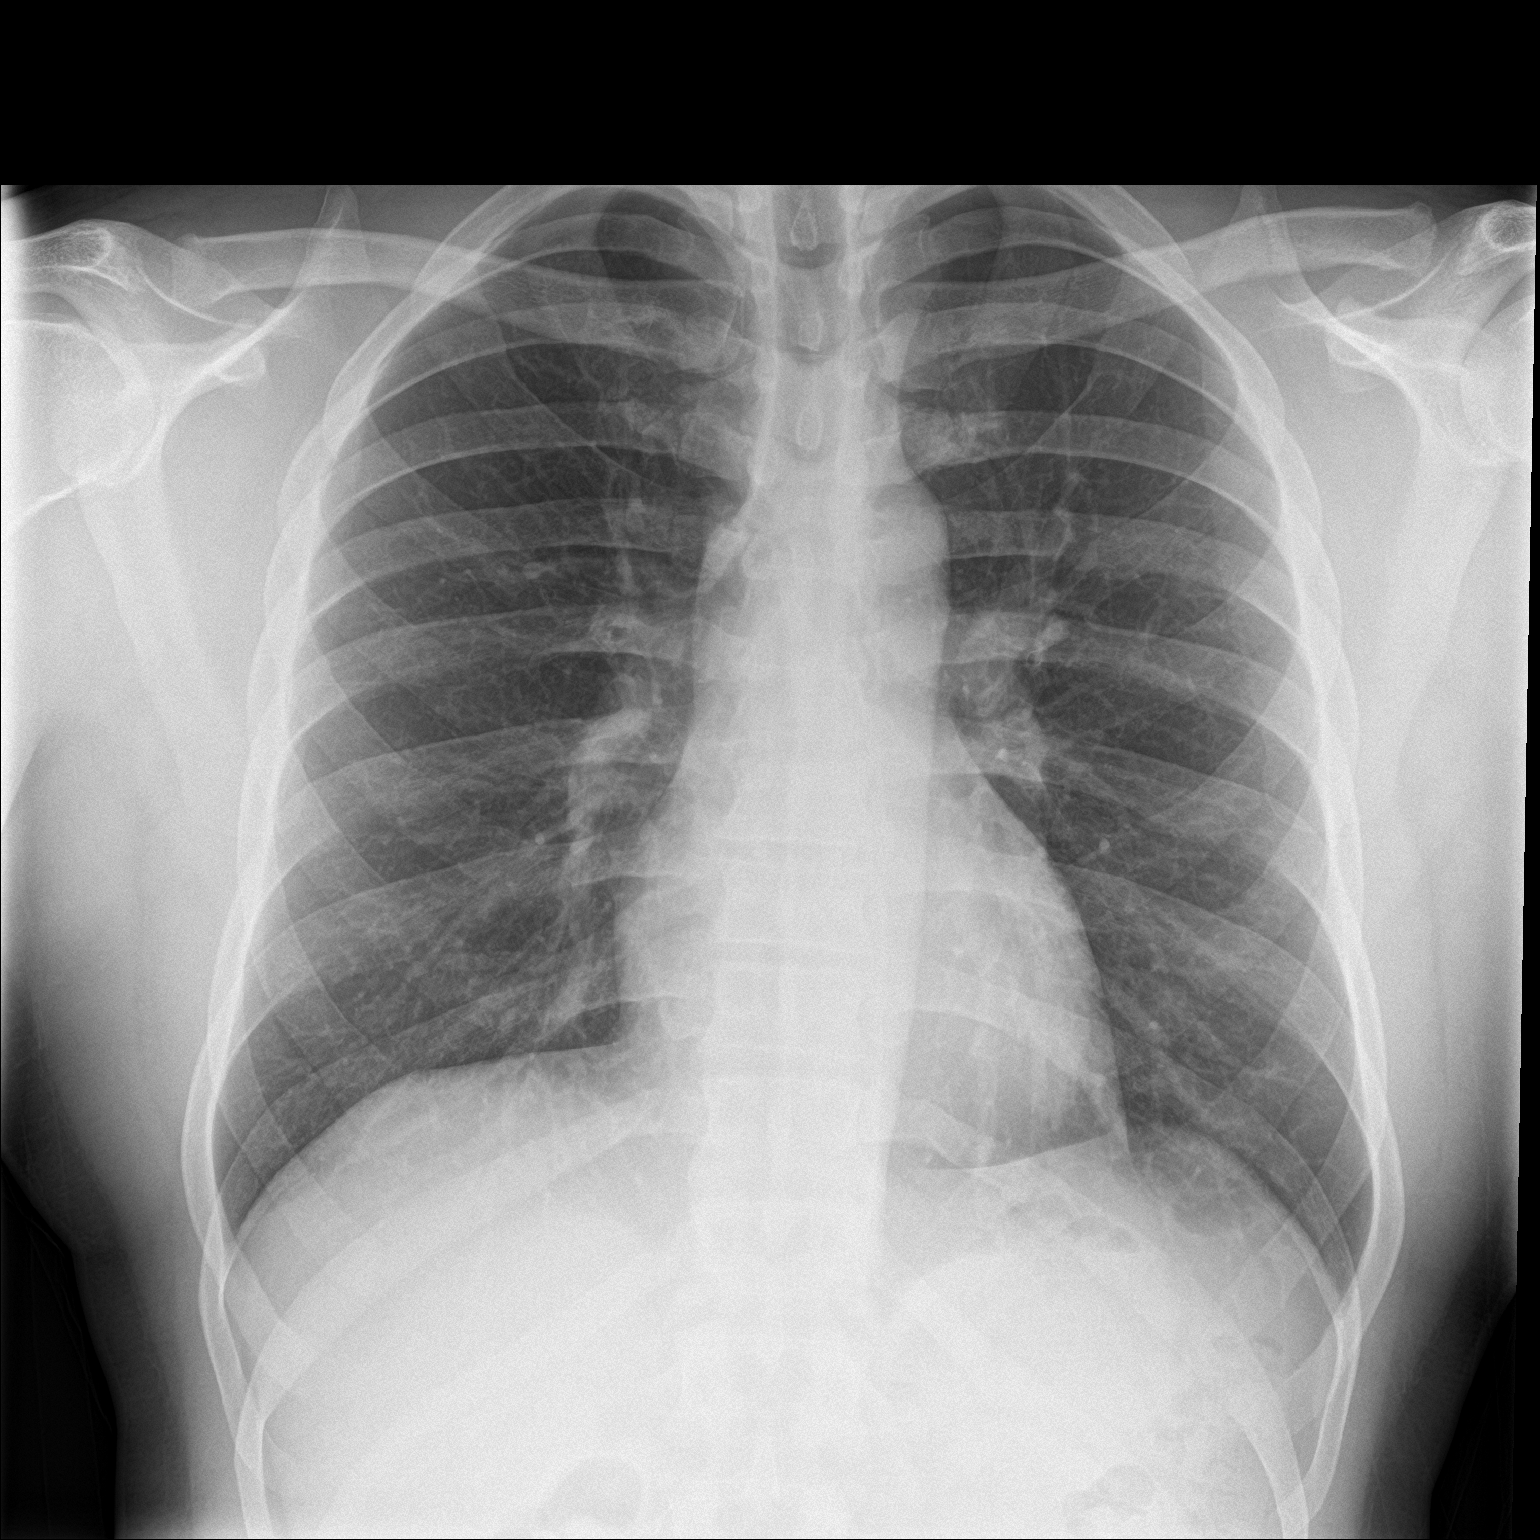

[chest lat]
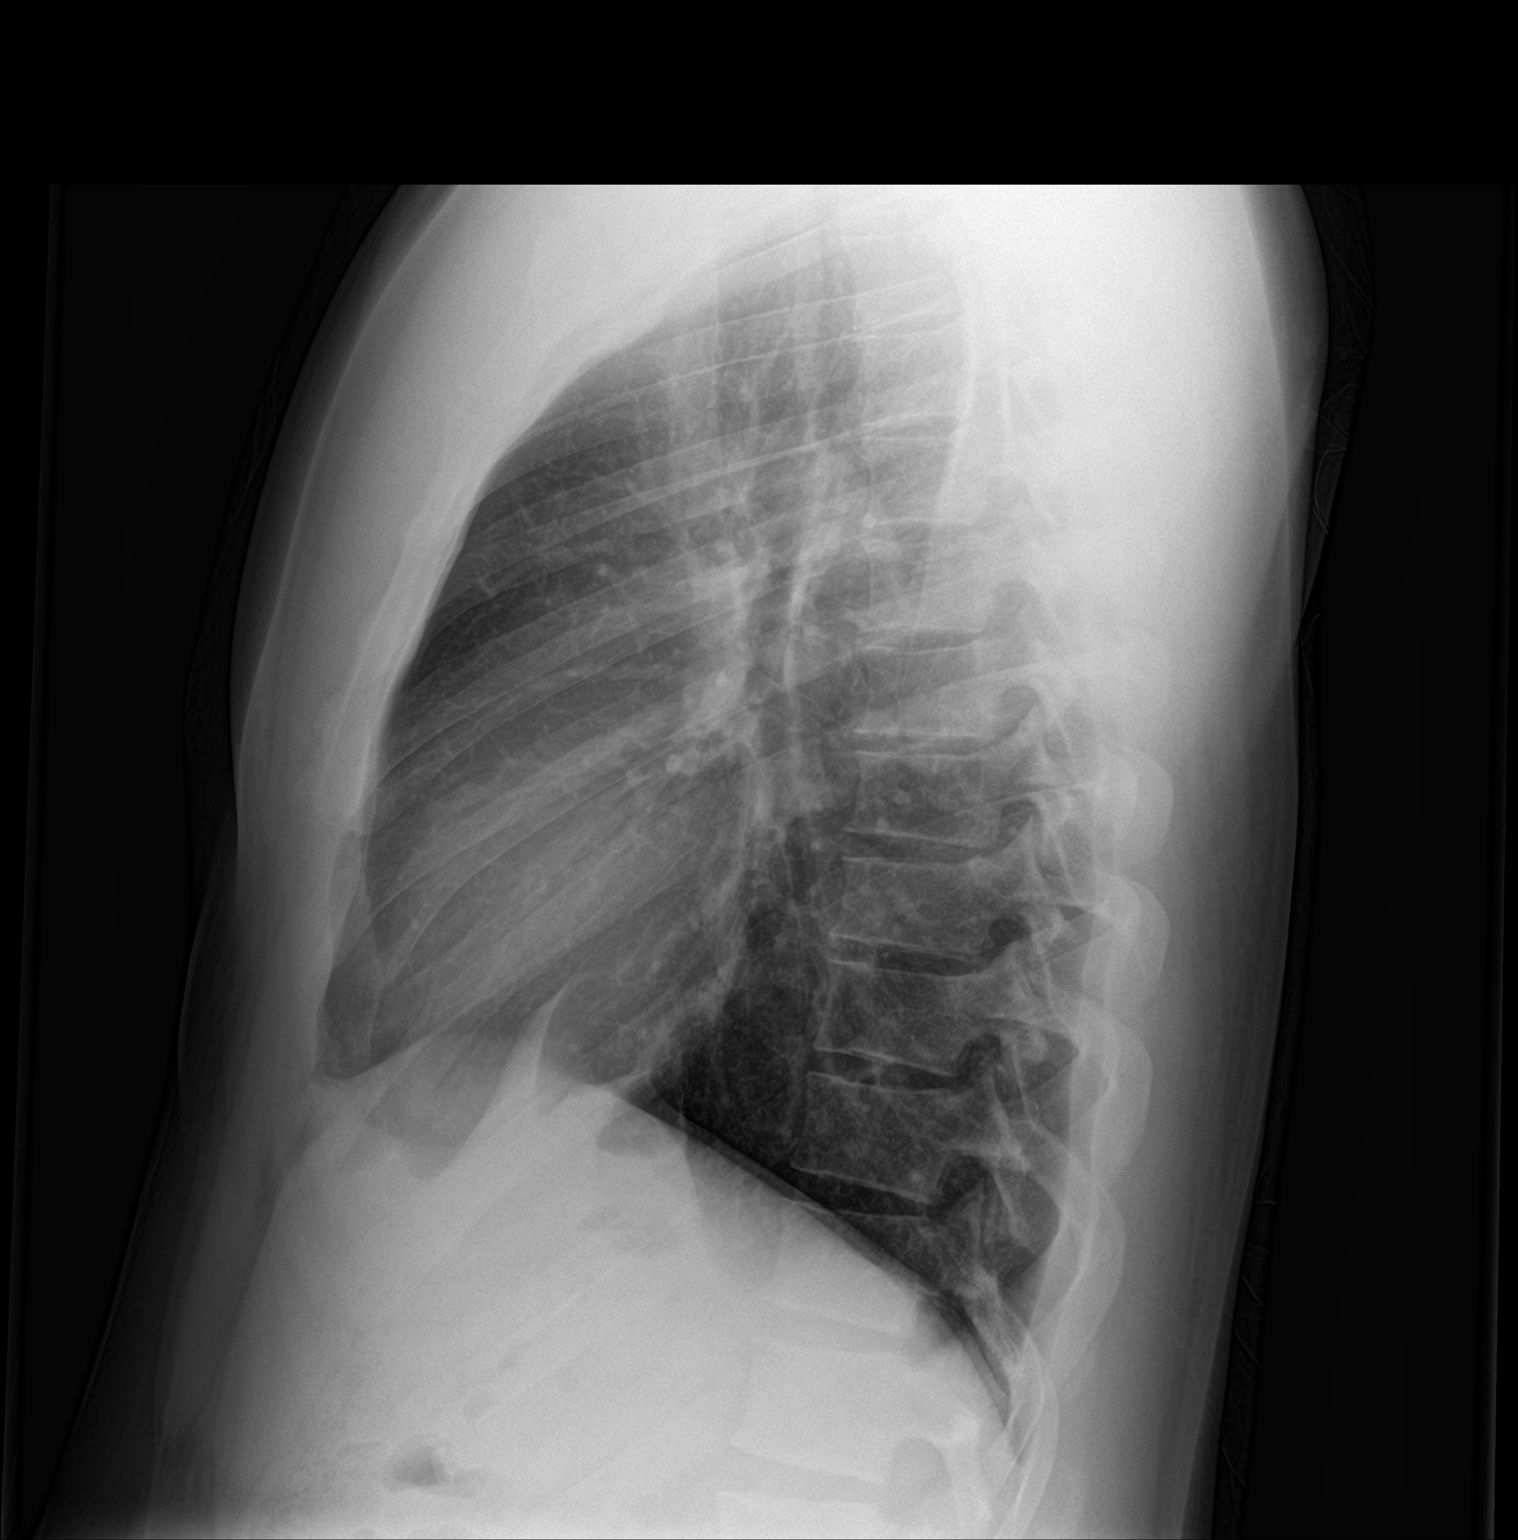

[2 of 2 positions shown; findings below may reference images not displayed]

FINDINGS: The cardiomediastinal silhouette is unremarkable.

There is no evidence of focal airspace disease, pulmonary edema,
suspicious pulmonary nodule/mass, pleural effusion, or pneumothorax.

No acute bony abnormalities are identified.
IMPRESSION: No active cardiopulmonary disease.

## 2019-12-18 ENCOUNTER — Encounter: Payer: Self-pay | Admitting: Gastroenterology

## 2020-01-30 ENCOUNTER — Ambulatory Visit: Payer: Medicaid Other | Admitting: Gastroenterology

## 2020-09-09 ENCOUNTER — Ambulatory Visit (HOSPITAL_COMMUNITY)
Admission: EM | Admit: 2020-09-09 | Discharge: 2020-09-09 | Disposition: A | Discharge status: Home / Self Care | Payer: Medicaid Other

## 2020-09-09 ENCOUNTER — Other Ambulatory Visit: Payer: Self-pay

## 2020-09-09 ENCOUNTER — Encounter (HOSPITAL_COMMUNITY): Payer: Self-pay | Admitting: Emergency Medicine

## 2020-09-09 DIAGNOSIS — R109 Unspecified abdominal pain: Secondary | ICD-10-CM

## 2020-09-09 DIAGNOSIS — B351 Tinea unguium: Secondary | ICD-10-CM

## 2020-09-09 LAB — POCT URINALYSIS DIPSTICK, ED / UC
Bilirubin Urine: NEGATIVE
Glucose, UA: NEGATIVE mg/dL
Hgb urine dipstick: NEGATIVE
Ketones, ur: NEGATIVE mg/dL
Leukocytes,Ua: NEGATIVE
Nitrite: NEGATIVE
Protein, ur: NEGATIVE mg/dL
Specific Gravity, Urine: 1.015 (ref 1.005–1.030)
Urobilinogen, UA: 0.2 mg/dL (ref 0.0–1.0)
pH: 6 (ref 5.0–8.0)

## 2020-09-09 MED ORDER — FLUCONAZOLE 200 MG PO TABS: 200.0000 mg | ORAL_TABLET | ORAL | 0 refills | Status: AC

## 2020-09-09 MED ORDER — NAPROXEN 500 MG PO TABS: 500.0000 mg | ORAL_TABLET | Freq: Two times a day (BID) | ORAL | 0 refills | Status: AC

## 2020-09-09 MED ORDER — TIZANIDINE HCL 4 MG PO TABS: 4.0000 mg | ORAL_TABLET | Freq: Four times a day (QID) | ORAL | 0 refills | Status: DC | PRN

## 2020-09-09 NOTE — Discharge Instructions (Addendum)
Take the Diflucan once a week for the next 12 weeks.  Follow-up with podiatry or primary care to ensure that fungal infection is getting better.   You can take the naproxen twice daily and Zanaflex as needed for side pain and muscle spasm.  Make sure that you are drinking plenty of fluids and resting as needed.  Get establish and follow-up with primary care for long-term management of pain.  Return or go to the Emergency Department if symptoms worsen or do not improve in the next few days.

## 2020-09-09 NOTE — ED Triage Notes (Signed)
Eft flank pain for 2 years.  Clarified duration.  Pain shifts slightly toward abdomen or slightly to the back.  Denies diarrhea or constipation.  Reports urinary urgency.

## 2020-09-09 NOTE — ED Provider Notes (Signed)
[facility]    CSN: [id number] Arrival date & time: 09/09/20  1125      History   Chief Complaint Chief Complaint  Patient presents with  . Flank Pain    HPI Eric Kent is a 40 y.o. male.   Patient here for evaluation of left side/flank pain that has been ongoing for the past 2 years.  Reports being evaluated in for same in the past and was referred to a "muscle specialist" but was unable to go to that follow-up.  Reports pain is intermittent and achy.  Pain is primarily on the left side but can radiate to left lower quadrant or groin and to the left flank. Patient was previously was prescribed Bentyl which he states was not not beneficial.  Reports urinary frequency but also reports that he drinks a lot of water.  Denies any dysuria, urgency, or difficulty urinating. Denies any specific alleviating or aggravating factors.  Denies any fevers, chest pain, shortness of breath, N/V/D, numbness, tingling, weakness, abdominal pain, or headaches.   Also reports having bilateral great toenail fungus.  ROS: As per HPI, all other pertinent ROS negative    The history is provided by the patient.  Flank Pain    Past Medical History:  Diagnosis Date  . GERD (gastroesophageal reflux disease)   . Hypertension   . Ulcer     There are no problems to display for this patient.   Past Surgical History:  Procedure Laterality Date  . TONSILLECTOMY         Home Medications    Prior to Admission medications   Medication Sig Start Date End Date Taking? Authorizing Provider  fluconazole (DIFLUCAN) 200 MG tablet Take 1 tablet (200 mg total) by mouth once a week for 12 doses. 09/09/20 11/26/20 Yes Ivette Loyal, NP  naproxen (NAPROSYN) 500 MG tablet Take 1 tablet (500 mg total) by mouth 2 (two) times daily. 09/09/20  Yes Ivette Loyal, NP  NON FORMULARY Takes "hydro something" for blood pressure, does not take regularly.  Last pill was one week ago   Yes [provider]  tiZANidine (ZANAFLEX) 4 MG tablet Take 1 tablet (4 mg total) by mouth every 6 (six) hours as needed for muscle spasms. 09/09/20  Yes Ivette Loyal, NP  clindamycin (CLEOCIN) 300 MG capsule Take 1 capsule (300 mg total) by mouth 3 (three) times daily. 12/07/19   Wallis Bamberg, PA-C    Family History History reviewed. No pertinent family history.  Social History Social History   Tobacco Use  . Smoking status: Former Smoker    Quit date: 09/16/2011    Years since quitting: 8.9  . Smokeless tobacco: Never Used  Vaping Use  . Vaping Use: Never used  Substance Use Topics  . Alcohol use: Not Currently    Comment: occ  . Drug use: No     Allergies   Penicillins   Review of Systems Review of Systems  Genitourinary: Positive for flank pain.  All other systems reviewed and are negative.    Physical Exam Triage Vital Signs ED Triage Vitals  Enc Vitals Group     BP 09/09/20 1156 (!) 147/99     Pulse Rate 09/09/20 1156 65     Resp 09/09/20 1156 20     Temp 09/09/20 1156 98.6 F (37 C)     Temp Source 09/09/20 1156 Oral     SpO2 09/09/20 1156 96 %     Weight --  Height --      Head Circumference --      Peak Flow --      Pain Score 09/09/20 1153 7     Pain Loc --      Pain Edu? --      Excl. in GC? --    No data found.  Updated Vital Signs BP (!) 147/99 (BP Location: Left Arm) Comment (BP Location): large cuff  Pulse 65   Temp 98.6 F (37 C) (Oral)   Resp 20   SpO2 96%   Visual Acuity Right Eye Distance:   Left Eye Distance:   Bilateral Distance:    Right Eye Near:   Left Eye Near:    Bilateral Near:     Physical Exam Vitals and nursing note reviewed.  Constitutional:      General: He is not in acute distress.    Appearance: Normal appearance. He is not ill-appearing, toxic-appearing or diaphoretic.  HENT:     Head: Normocephalic and atraumatic.  Eyes:     Conjunctiva/sclera: Conjunctivae normal.  Cardiovascular:     Rate and  Rhythm: Normal rate.     Pulses: Normal pulses.  Pulmonary:     Effort: Pulmonary effort is normal.  Abdominal:     General: Abdomen is flat. There is no distension.     Tenderness: There is no abdominal tenderness. There is no right CVA tenderness, left CVA tenderness or guarding.  Musculoskeletal:        General: Normal range of motion.     Cervical back: Normal range of motion.  Feet:     Right foot:     Toenail Condition: Right toenails are abnormally thick. Fungal disease present.    Left foot:     Toenail Condition: Left toenails are abnormally thick. Fungal disease present.    Comments: See photo Skin:    General: Skin is warm and dry.  Neurological:     General: No focal deficit present.     Mental Status: He is alert and oriented to person, place, and time.  Psychiatric:        Mood and Affect: Mood normal.        UC Treatments / Results  Labs (all labs ordered are listed, but only abnormal results are displayed) Labs Reviewed  POCT URINALYSIS DIPSTICK, ED / UC    EKG   Radiology No results found.  Procedures Procedures (including critical care time)  Medications Ordered in UC Medications - No data to display  Initial Impression / Assessment and Plan / UC Course  I have reviewed the triage vital signs and the nursing notes.  Pertinent labs & imaging results that were available during my care of the patient were reviewed by me and considered in my medical decision making (see chart for details).    Left flank pain, side pain Assessment negative for any red flags or concerns.  Urinalysis with no signs of infection.  In previous notes providers believe this to be muscular in origin which which is the most likely cause based on assessment today.  We will treat with naproxen twice daily and Zanaflex as needed for muscle pain and spasms.  Encourage fluids and rest.  Recommend getting established and following up with primary care for long-term management of  pain.    Onchomycosis of bilateral great toes Diflucan once weekly for the next 12weeks for management fungal infection in toes.  Can continue to use foot current creams for comfort.  Recommend following  up with podiatry if symptoms do not improve in the next few weeks.  Follow-up with primary care as needed.  Final Clinical Impressions(s) / UC Diagnoses   Final diagnoses:  Left flank pain  Side pain  Onychomycosis of great toe     Discharge Instructions     Take the Diflucan once a week for the next 12 weeks.  Follow-up with podiatry or primary care to ensure that fungal infection is getting better.   You can take the naproxen twice daily and Zanaflex as needed for side pain and muscle spasm.  Make sure that you are drinking plenty of fluids and resting as needed.  Get establish and follow-up with primary care for long-term management of pain.  Return or go to the Emergency Department if symptoms worsen or do not improve in the next few days.        ED Prescriptions    Medication Sig Dispense Auth. Provider   naproxen (NAPROSYN) 500 MG tablet Take 1 tablet (500 mg total) by mouth 2 (two) times daily. 30 tablet Ivette Loyal, NP   tiZANidine (ZANAFLEX) 4 MG tablet Take 1 tablet (4 mg total) by mouth every 6 (six) hours as needed for muscle spasms. 30 tablet Ivette Loyal, NP   fluconazole (DIFLUCAN) 200 MG tablet Take 1 tablet (200 mg total) by mouth once a week for 12 doses. 12 tablet Ivette Loyal, NP     PDMP not reviewed this encounter.   Ivette Loyal, NP 09/09/20 1313

## 2020-11-23 NOTE — Progress Notes (Signed)
Patient did not show for appointment.   

## 2020-11-24 ENCOUNTER — Encounter: Payer: Self-pay | Admitting: Family

## 2021-02-21 ENCOUNTER — Encounter: Payer: Self-pay | Admitting: Gastroenterology

## 2021-03-04 ENCOUNTER — Ambulatory Visit (HOSPITAL_COMMUNITY): Payer: Medicaid Other

## 2021-03-05 ENCOUNTER — Ambulatory Visit (HOSPITAL_COMMUNITY)
Admission: EM | Admit: 2021-03-05 | Discharge: 2021-03-05 | Disposition: A | Discharge status: Home / Self Care | Payer: Medicaid Other | Attending: Emergency Medicine | Admitting: Emergency Medicine

## 2021-03-05 ENCOUNTER — Other Ambulatory Visit: Payer: Self-pay

## 2021-03-05 ENCOUNTER — Encounter (HOSPITAL_COMMUNITY): Payer: Self-pay | Admitting: *Deleted

## 2021-03-05 DIAGNOSIS — R109 Unspecified abdominal pain: Secondary | ICD-10-CM

## 2021-03-05 DIAGNOSIS — B351 Tinea unguium: Secondary | ICD-10-CM

## 2021-03-05 MED ORDER — FLUCONAZOLE 200 MG PO TABS: 200.0000 mg | ORAL_TABLET | ORAL | 0 refills | Status: AC

## 2021-03-05 NOTE — Discharge Instructions (Addendum)
You have an appointment with the Gastroenterologist on Oct. 28 at 1:50pm.  I have put in a referral for someone to contact you to help get you set up with a primary care provider.   Take the Fluconazole once a week for the next 12 weeks.  You can follow up with [facility] if your symptoms do not improve.   Return or go to the Emergency Department if symptoms worsen or do not improve in the next few days.

## 2021-03-05 NOTE — ED Triage Notes (Signed)
Pt reports he missed his referral  apt and needs another referral. Pt still has Lt sided pain.

## 2021-03-05 NOTE — ED Provider Notes (Signed)
[facility]    CSN: [id number] Arrival date & time: 03/05/21  1645      History   Chief Complaint Chief Complaint  Patient presents with   side pain    HPI Eric Kent is a 40 y.o. male.   Patient here requesting new referrals to specialists as he missed his referrals.  Patient was seen several months ago for left side pain and was reportedly referred to gastroenterology as well as a "muscle specialist".  Reports that he missed his appointments due to a lack of funds and states that he had "a lot going on".  Reports that he is still having side pain and that pain has not improved or worsened in the past several months.  Also states that he was unable to pick up the prescription that he was given for his fungal infection to his toes.  Denies any trauma, injury, or other precipitating event.  Denies any specific alleviating or aggravating factors.  Denies any fevers, chest pain, shortness of breath, N/V/D, numbness, tingling, weakness, abdominal pain, or headaches.    The history is provided by the patient.   Past Medical History:  Diagnosis Date   GERD (gastroesophageal reflux disease)    Hypertension    Ulcer     There are no problems to display for this patient.   Past Surgical History:  Procedure Laterality Date   TONSILLECTOMY         Home Medications    Prior to Admission medications   Medication Sig Start Date End Date Taking? Authorizing Provider  fluconazole (DIFLUCAN) 200 MG tablet Take 1 tablet (200 mg total) by mouth once a week for 12 doses. 03/05/21 05/22/21 Yes Ivette Loyal, NP  clindamycin (CLEOCIN) 300 MG capsule Take 1 capsule (300 mg total) by mouth 3 (three) times daily. 12/07/19   Wallis Bamberg, PA-C  naproxen (NAPROSYN) 500 MG tablet Take 1 tablet (500 mg total) by mouth 2 (two) times daily. 09/09/20   Ivette Loyal, NP  NON FORMULARY Takes "hydro something" for blood pressure, does not take regularly.  Last pill was one week ago     [provider]  tiZANidine (ZANAFLEX) 4 MG tablet Take 1 tablet (4 mg total) by mouth every 6 (six) hours as needed for muscle spasms. 09/09/20   Ivette Loyal, NP    Family History History reviewed. No pertinent family history.  Social History Social History   Tobacco Use   Smoking status: Former    Types: Cigarettes    Quit date: 09/16/2011    Years since quitting: 9.4   Smokeless tobacco: Never  Vaping Use   Vaping Use: Never used  Substance Use Topics   Alcohol use: Not Currently    Comment: occ   Drug use: No     Allergies   Penicillins   Review of Systems Review of Systems  Genitourinary:  Positive for flank pain.  All other systems reviewed and are negative.   Physical Exam Triage Vital Signs ED Triage Vitals  Enc Vitals Group     BP 03/05/21 1848 (!) 146/95     Pulse Rate 03/05/21 1848 (!) 59     Resp 03/05/21 1848 20     Temp 03/05/21 1848 98.2 F (36.8 C)     Temp src --      SpO2 03/05/21 1848 97 %     Weight --      Height --      Head Circumference --  Peak Flow --      Pain Score 03/05/21 1847 7     Pain Loc --      Pain Edu? --      Excl. in GC? --    No data found.  Updated Vital Signs BP (!) 146/95   Pulse (!) 59   Temp 98.2 F (36.8 C)   Resp 20   SpO2 97%   Visual Acuity Right Eye Distance:   Left Eye Distance:   Bilateral Distance:    Right Eye Near:   Left Eye Near:    Bilateral Near:     Physical Exam Vitals and nursing note reviewed.  Constitutional:      General: He is not in acute distress.    Appearance: Normal appearance. He is not ill-appearing, toxic-appearing or diaphoretic.  HENT:     Head: Normocephalic and atraumatic.  Eyes:     Conjunctiva/sclera: Conjunctivae normal.  Cardiovascular:     Rate and Rhythm: Normal rate.     Pulses: Normal pulses.  Pulmonary:     Effort: Pulmonary effort is normal.  Abdominal:     General: Abdomen is flat.     Tenderness: There is no abdominal  tenderness. There is no right CVA tenderness or left CVA tenderness.  Musculoskeletal:        General: Normal range of motion.     Cervical back: Normal range of motion.  Feet:     Right foot:     Toenail Condition: Right toenails are abnormally thick. Fungal disease present.    Left foot:     Toenail Condition: Left toenails are abnormally thick. Fungal disease present. Skin:    General: Skin is warm and dry.  Neurological:     General: No focal deficit present.     Mental Status: He is alert and oriented to person, place, and time.  Psychiatric:        Mood and Affect: Mood normal.     UC Treatments / Results  Labs (all labs ordered are listed, but only abnormal results are displayed) Labs Reviewed - No data to display  EKG   Radiology No results found.  Procedures Procedures (including critical care time)  Medications Ordered in UC Medications - No data to display  Initial Impression / Assessment and Plan / UC Course  I have reviewed the triage vital signs and the nursing notes.  Pertinent labs & imaging results that were available during my care of the patient were reviewed by me and considered in my medical decision making (see chart for details).    Assessment negative for red flags or concerns. Side pain Patient had been referred to gastroenterology for further evaluation of side and flank pain.  Patient currently has an appointment scheduled for October 28 at 1:50.  Patient encouraged to attend this appointment and he verbalized understanding.  Discussed with patient the importance of getting established with a primary care provider for further evaluation and long-term management of health problems.  PCP assistance started.  Onychomycosis of the great toes bilaterally Fluconazole once a week for the next 12 weeks.  Follow-up with podiatry or primary care for reevaluation as needed Final Clinical Impressions(s) / UC Diagnoses   Final diagnoses:  Side pain   Onychomycosis of great toe     Discharge Instructions      You have an appointment with the Gastroenterologist on Oct. 28 at 1:50pm.  I have put in a referral for someone to contact you to help  get you set up with a primary care provider.   Take the Fluconazole once a week for the next 12 weeks.  You can follow up with [facility] if your symptoms do not improve.   Return or go to the Emergency Department if symptoms worsen or do not improve in the next few days.       ED Prescriptions     Medication Sig Dispense Auth. Provider   fluconazole (DIFLUCAN) 200 MG tablet Take 1 tablet (200 mg total) by mouth once a week for 12 doses. 12 tablet Ivette Loyal, NP      PDMP not reviewed this encounter.   Ivette Loyal, NP 03/05/21 [phone]

## 2021-03-18 ENCOUNTER — Ambulatory Visit: Payer: Medicaid Other | Admitting: Gastroenterology

## 2021-03-22 ENCOUNTER — Ambulatory Visit: Payer: Medicaid Other | Admitting: Physician Assistant

## 2021-04-08 NOTE — Progress Notes (Deleted)
04/08/2021 Eric Kent [id number] 01-06-1981   CHIEF COMPLAINT:   HISTORY OF PRESENT ILLNESS: Pleasant 40 year old male with a past medical history pretension.  He presents to our office today for further evaluation regarding left-sided abdominal/flank pain.  Patient seen urgent care 09/09/2020 with left-sided flank pain.  At that time, the pain also radiated to his left lower quadrant.  Which was ineffective.  His left flank pain was thought to be musculoskeletal and he was treated with naproxen and Zanaflex. Also seen in the ED 11/2019 with left-sided pain which was treated as suspected musculoskeletal in the ED was advised to schedule GI evaluation.   CBC Latest Ref Rng & Units 08/10/2018 02/12/2018 10/01/2017  WBC 4.0 - 10.5 K/uL 4.7 4.0 -  Hemoglobin 13.0 - 17.0 g/dL 15.6 14.5 16.3  Hematocrit 39.0 - 52.0 % 50.0 46.2 48.0  Platelets 150 - 400 K/uL 265 229 -    CMP Latest Ref Rng & Units 08/10/2018 02/12/2018 10/01/2017  Glucose 70 - 99 mg/dL 96 101(H) 66  BUN 6 - 20 mg/dL 8 8 8   Creatinine 0.61 - 1.24 mg/dL 1.45(H) 1.21 1.40(H)  Sodium 135 - 145 mmol/L 137 138 141  Potassium 3.5 - 5.1 mmol/L 3.6 4.3 3.9  Chloride 98 - 111 mmol/L 104 104 102  CO2 22 - 32 mmol/L 25 26 -  Calcium 8.9 - 10.3 mg/dL 9.5 8.8(L) -  Total Protein 6.5 - 8.1 g/dL 6.5 6.5 -  Total Bilirubin 0.3 - 1.2 mg/dL 0.9 0.9 -  Alkaline Phos 38 - 126 U/L 73 83 -  AST 15 - 41 U/L 25 24 -  ALT 0 - 44 U/L 26 18 -    Renal stone CT without contrast 08/10/2018: 1. No acute findings are noted in the abdomen or pelvis to account for the patient's symptoms. 2. 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 This is likely a chronic finding, as evidence of this is present in retrospect on prior chest x-ray 04/08/2018.      Past Medical History:  Diagnosis Date   GERD (gastroesophageal reflux  disease)    Hypertension    Ulcer    Past Surgical History:  Procedure Laterality Date   TONSILLECTOMY     Social History:  Family History:    reports that he quit smoking about 9 years ago. He has never used smokeless tobacco. He reports that he does not currently use alcohol. He reports that he does not use drugs. family history is not on file. Allergies  Allergen Reactions   Penicillins Hives    Did it involve swelling of the face/tongue/throat, SOB, or low BP? NO Did it involve sudden or severe rash/hives, skin peeling, or any reaction on the inside of your mouth or nose? YES Did you need to seek medical attention at a hospital or doctor's office? YES When did it last happen? 8 years ago  If all above answers are "NO", may proceed with cephalosporin use.      Outpatient Encounter Medications as of 04/11/2021  Medication Sig   clindamycin (CLEOCIN) 300 MG capsule Take 1 capsule (300 mg total) by mouth 3 (three) times daily.   fluconazole (DIFLUCAN) 200 MG tablet Take 1 tablet (200 mg total) by mouth once a week for 12 doses.   naproxen (NAPROSYN) 500 MG tablet Take 1 tablet (500 mg total) by mouth 2 (two) times  daily.   NON FORMULARY Takes "hydro something" for blood pressure, does not take regularly.  Last pill was one week ago   tiZANidine (ZANAFLEX) 4 MG tablet Take 1 tablet (4 mg total) by mouth every 6 (six) hours as needed for muscle spasms.   No facility-administered encounter medications on file as of 04/11/2021.     REVIEW OF SYSTEMS: All other systems reviewed and negative except where noted in the History of Present Illness.  Gen: Denies fever, sweats or chills. No weight loss.  CV: Denies chest pain, palpitations or edema. Resp: Denies cough, shortness of breath of hemoptysis.  GI: Denies heartburn, dysphagia, stomach or lower abdominal pain. No diarrhea or constipation.  GU : Denies urinary burning, blood in urine, increased urinary frequency or  incontinence. MS: Denies joint pain, muscles aches or weakness. Derm: Denies rash, itchiness, skin lesions or unhealing ulcers. Psych: Denies depression, anxiety, memory loss, suicidal ideation and confusion. Heme: Denies bruising, bleeding. Neuro:  Denies headaches, dizziness or paresthesias. Endo:  Denies any problems with DM, thyroid or adrenal function.   PHYSICAL EXAM: There were no vitals taken for this visit. General: Well developed ... in no acute distress. Head: Normocephalic and atraumatic. Eyes:  Sclerae non-icteric, conjunctive pink. Ears: Normal auditory acuity. Mouth: Dentition intact. No ulcers or lesions.  Neck: Supple, no lymphadenopathy or thyromegaly.  Lungs: Clear bilaterally to auscultation without wheezes, crackles or rhonchi. Heart: Regular rate and rhythm. No murmur, rub or gallop appreciated.  Abdomen: Soft, nontender, non distended. No masses. No hepatosplenomegaly. Normoactive bowel sounds x 4 quadrants.  Rectal:  Musculoskeletal: Symmetrical with no gross deformities. Skin: Warm and dry. No rash or lesions on visible extremities. Extremities: No edema. Neurological: Alert oriented x 4, no focal deficits.  Psychological:  Alert and cooperative. Normal mood and affect.  ASSESSMENT AND PLAN:    CC:  No ref. provider found

## 2021-04-11 ENCOUNTER — Ambulatory Visit: Payer: Self-pay | Admitting: Nurse Practitioner

## 2021-04-28 ENCOUNTER — Ambulatory Visit: Payer: Medicaid Other | Admitting: Nurse Practitioner

## 2021-05-12 ENCOUNTER — Ambulatory Visit
Admission: RE | Admit: 2021-05-12 | Discharge: 2021-05-12 | Disposition: A | Discharge status: Home / Self Care | Payer: Medicaid Other | Source: Ambulatory Visit | Attending: Gastroenterology | Admitting: Gastroenterology

## 2021-05-12 ENCOUNTER — Other Ambulatory Visit: Payer: Self-pay | Admitting: Gastroenterology

## 2021-05-12 DIAGNOSIS — R140 Abdominal distension (gaseous): Secondary | ICD-10-CM

## 2021-07-06 ENCOUNTER — Other Ambulatory Visit: Payer: Self-pay | Admitting: Gastroenterology

## 2021-09-25 ENCOUNTER — Emergency Department (HOSPITAL_COMMUNITY)
Admission: EM | Admit: 2021-09-25 | Discharge: 2021-09-26 | Disposition: A | Discharge status: Home / Self Care | Payer: Self-pay | Attending: Emergency Medicine | Admitting: Emergency Medicine

## 2021-09-25 DIAGNOSIS — R109 Unspecified abdominal pain: Secondary | ICD-10-CM

## 2021-09-25 DIAGNOSIS — R110 Nausea: Secondary | ICD-10-CM | POA: Insufficient documentation

## 2021-09-25 DIAGNOSIS — R1012 Left upper quadrant pain: Secondary | ICD-10-CM | POA: Insufficient documentation

## 2021-09-25 DIAGNOSIS — R1013 Epigastric pain: Secondary | ICD-10-CM | POA: Insufficient documentation

## 2021-09-25 DIAGNOSIS — R1032 Left lower quadrant pain: Secondary | ICD-10-CM | POA: Insufficient documentation

## 2021-09-25 DIAGNOSIS — I10 Essential (primary) hypertension: Secondary | ICD-10-CM | POA: Insufficient documentation

## 2021-09-25 LAB — CBC WITH DIFFERENTIAL/PLATELET
Abs Immature Granulocytes: 0 10*3/uL (ref 0.00–0.07)
Basophils Absolute: 0 10*3/uL (ref 0.0–0.1)
Basophils Relative: 1 %
Eosinophils Absolute: 0.2 10*3/uL (ref 0.0–0.5)
Eosinophils Relative: 4 %
HCT: 49.1 % (ref 39.0–52.0)
Hemoglobin: 16.1 g/dL (ref 13.0–17.0)
Immature Granulocytes: 0 %
Lymphocytes Relative: 34 %
Lymphs Abs: 1.2 10*3/uL (ref 0.7–4.0)
MCH: 29.4 pg (ref 26.0–34.0)
MCHC: 32.8 g/dL (ref 30.0–36.0)
MCV: 89.6 fL (ref 80.0–100.0)
Monocytes Absolute: 0.4 10*3/uL (ref 0.1–1.0)
Monocytes Relative: 10 %
Neutro Abs: 1.9 10*3/uL (ref 1.7–7.7)
Neutrophils Relative %: 51 %
Platelets: 227 10*3/uL (ref 150–400)
RBC: 5.48 MIL/uL (ref 4.22–5.81)
RDW: 12.6 % (ref 11.5–15.5)
WBC: 3.7 10*3/uL — ABNORMAL LOW (ref 4.0–10.5)
nRBC: 0 % (ref 0.0–0.2)

## 2021-09-25 LAB — URINALYSIS, ROUTINE W REFLEX MICROSCOPIC
Bacteria, UA: NONE SEEN
Bilirubin Urine: NEGATIVE
Glucose, UA: NEGATIVE mg/dL
Hgb urine dipstick: NEGATIVE
Ketones, ur: 5 mg/dL — AB
Nitrite: NEGATIVE
Protein, ur: 30 mg/dL — AB
Specific Gravity, Urine: 1.033 — ABNORMAL HIGH (ref 1.005–1.030)
pH: 5 (ref 5.0–8.0)

## 2021-09-25 LAB — COMPREHENSIVE METABOLIC PANEL
ALT: 17 U/L (ref 0–44)
AST: 16 U/L (ref 15–41)
Albumin: 4.1 g/dL (ref 3.5–5.0)
Alkaline Phosphatase: 89 U/L (ref 38–126)
Anion gap: 5 (ref 5–15)
BUN: 20 mg/dL (ref 6–20)
CO2: 28 mmol/L (ref 22–32)
Calcium: 9.1 mg/dL (ref 8.9–10.3)
Chloride: 106 mmol/L (ref 98–111)
Creatinine, Ser: 1.35 mg/dL — ABNORMAL HIGH (ref 0.61–1.24)
GFR, Estimated: >60 mL/min (ref >60)
Glucose, Bld: 88 mg/dL (ref 70–99)
Potassium: 4.2 mmol/L (ref 3.5–5.1)
Sodium: 139 mmol/L (ref 135–145)
Total Bilirubin: 0.9 mg/dL (ref 0.3–1.2)
Total Protein: 7.5 g/dL (ref 6.5–8.1)

## 2021-09-25 LAB — LIPASE, BLOOD: Lipase: 31 U/L (ref 11–51)

## 2021-09-25 MED ORDER — FAMOTIDINE IN NACL 20-0.9 MG/50ML-% IV SOLN
20.0000 mg | Freq: Once | INTRAVENOUS | Status: AC
  Administered 2021-09-25: 20 mg via INTRAVENOUS
  Filled 2021-09-25: qty 50

## 2021-09-25 MED ORDER — SODIUM CHLORIDE 0.9 % IV BOLUS
1000.0000 mL | Freq: Once | INTRAVENOUS | Status: AC
  Administered 2021-09-25: 1000 mL via INTRAVENOUS

## 2021-09-25 NOTE — ED Provider Notes (Signed)
?[facility]-EMERGENCY DEPT ?Provider Note ? ? ?CSN: [id number] ?Arrival date & time: 09/25/21  1916 ? ?  ? ?History ? ?Chief Complaint  ?Patient presents with  ? Abdominal Pain  ? ? ?Eric Kent is a 41 y.o. male with a history of GERD and hypertension who presents to the ED with complaints of abdominal pain for about 1 year. Patient reports waxing/waning abdominal pain which he described as swelling in the abdomen which has gotten worse over the past couple of months. This is aggravated by eating, no consistent alleviating factors. Has tried peptobismol & tums with relief at times, but makes it worse at other times. Notes he tried 3-4 days of a PPI prescribed by GI but this did not help and seem to aggravate things so he stopped it. He reports some nausea, variable bowel movements sometimes dealing with constipation other times with diarrhea. Seen by GI and had reassuring xray and stool testing. He denies fever, significant weight change, melena, hematochezia, dysuria, or emesis.  ? ?HPI ? ?  ? ?Home Medications ?Prior to Admission medications   ?Medication Sig Start Date End Date Taking? Authorizing Provider  ?clindamycin (CLEOCIN) 300 MG capsule Take 1 capsule (300 mg total) by mouth 3 (three) times daily. 12/07/19   Wallis Bamberg, PA-C  ?naproxen (NAPROSYN) 500 MG tablet Take 1 tablet (500 mg total) by mouth 2 (two) times daily. 09/09/20   Ivette Loyal, NP  ?NON FORMULARY Takes "hydro something" for blood pressure, does not take regularly.  Last pill was one week ago    [provider]  ?tiZANidine (ZANAFLEX) 4 MG tablet Take 1 tablet (4 mg total) by mouth every 6 (six) hours as needed for muscle spasms. 09/09/20   Ivette Loyal, NP  ?   ? ?Allergies    ?Penicillins   ? ?Review of Systems   ?Review of Systems  ?Constitutional:  Negative for chills, fever and unexpected weight change.  ?Respiratory:  Negative for shortness of breath.   ?Cardiovascular:  Negative for chest pain.   ?Gastrointestinal:  Positive for abdominal pain, constipation, diarrhea and nausea. Negative for blood in stool and vomiting.  ?Genitourinary:  Negative for dysuria.  ?Neurological:  Negative for syncope.  ?All other systems reviewed and are negative. ? ?Physical Exam ?Updated Vital Signs ?BP 120/83 (BP Location: Left Arm)   Pulse 89   Temp 97.9 ?F (36.6 ?C) (Oral)   Resp 17   Ht 6' (1.829 m)   Wt 99.8 kg   SpO2 94%   BMI 29.84 kg/m?  ?Physical Exam ?Vitals and nursing note reviewed.  ?Constitutional:   ?   General: He is not in acute distress. ?   Appearance: He is well-developed. He is not toxic-appearing.  ?HENT:  ?   Head: Normocephalic and atraumatic.  ?Eyes:  ?   General:     ?   Right eye: No discharge.     ?   Left eye: No discharge.  ?   Conjunctiva/sclera: Conjunctivae normal.  ?Cardiovascular:  ?   Rate and Rhythm: Normal rate and regular rhythm.  ?Pulmonary:  ?   Effort: No respiratory distress.  ?   Breath sounds: Normal breath sounds. No wheezing or rales.  ?Abdominal:  ?   General: There is no distension.  ?   Palpations: Abdomen is soft.  ?   Tenderness: There is abdominal tenderness in the epigastric area, left upper quadrant and left lower quadrant. There is no guarding or rebound.  ?  Musculoskeletal:  ?   Cervical back: Neck supple.  ?Skin: ?   General: Skin is warm and dry.  ?Neurological:  ?   Mental Status: He is alert.  ?   Comments: Clear speech.   ?Psychiatric:     ?   Behavior: Behavior normal.  ? ? ?ED Results / Procedures / Treatments   ?Labs ?(all labs ordered are listed, but only abnormal results are displayed) ?Labs Reviewed  ?COMPREHENSIVE METABOLIC PANEL - Abnormal; Notable for the following components:  ?    Result Value  ? Creatinine, Ser 1.35 (*)   ? All other components within normal limits  ?CBC WITH DIFFERENTIAL/PLATELET - Abnormal; Notable for the following components:  ? WBC 3.7 (*)   ? All other components within normal limits  ?URINALYSIS, ROUTINE W REFLEX  MICROSCOPIC - Abnormal; Notable for the following components:  ? Specific Gravity, Urine 1.033 (*)   ? Ketones, ur 5 (*)   ? Protein, ur 30 (*)   ? Leukocytes,Ua TRACE (*)   ? All other components within normal limits  ?LIPASE, BLOOD  ? ? ?EKG ?None ? ?Radiology ?CT Abdomen Pelvis W Contrast ? ?Result Date: 09/26/2021 ?CLINICAL DATA:  Left lower quadrant abdominal pain. EXAM: CT ABDOMEN AND PELVIS WITH CONTRAST TECHNIQUE: Multidetector CT imaging of the abdomen and pelvis was performed using the standard protocol following bolus administration of intravenous contrast. RADIATION DOSE REDUCTION: This exam was performed according to the departmental dose-optimization program which includes automated exposure control, adjustment of the mA and/or kV according to patient size and/or use of iterative reconstruction technique. CONTRAST:  OMNIPAQUE IOHEXOL 300 MG/ML  SOLN COMPARISON:  CT dated 08/10/2018. FINDINGS: Lower chest: The visualized lung bases are clear. No intra-abdominal free air or free fluid. Hepatobiliary: Subcentimeter right hepatic hypodense lesion is too small to characterize, likely a cyst. The liver is otherwise unremarkable. No intrahepatic biliary dilatation. The gallbladder is unremarkable. Pancreas: Unremarkable. No pancreatic ductal dilatation or surrounding inflammatory changes. Spleen: Normal in size without focal abnormality. Adrenals/Urinary Tract: The adrenal glands are unremarkable. The kidneys, visualized ureters, and urinary bladder appear unremarkable. Stomach/Bowel: There is moderate stool throughout the colon. There is no bowel obstruction or active inflammation. The appendix is normal. Vascular/Lymphatic: The abdominal aorta and IVC unremarkable. No portal venous gas. There is no adenopathy. Faint small low attenuating densities posterior to the pancreas at the level of the celiac trunk measuring approximately 12 mm (2 4/2) are poorly evaluated but demonstrate fluid attenuation. These  may represent mildly enlarged lymphatic drainage or less likely lymph nodes. Reproductive: The prostate and seminal vesicles are grossly unremarkable. No pelvic mass. Other: None Musculoskeletal: No acute or significant osseous findings. IMPRESSION: 1. No acute intra-abdominal or pelvic pathology. 2. Moderate colonic stool burden. No bowel obstruction. Normal appendix. Electronically Signed   By: Elgie Collard M.D.   On: 09/26/2021 00:35   ? ?Procedures ?Procedures  ? ? ?Medications Ordered in ED ?Medications - No data to display ? ?ED Course/ Medical Decision Making/ A&P ?  ?                        ?Medical Decision Making ?Amount and/or Complexity of Data Reviewed ?Radiology: ordered. ? ?Risk ?Prescription drug management. ? ? ?Patient presents to the ED with complaints of abdominal pain, this involves an extensive number of treatment options, and is a complaint that carries with it a high risk of complications and morbidity. Nontoxic, vitals without significant abnormality.  ? ?  Additional history obtained:  ?Chart & nursing note reviewed.  ?External records reviewed including abdominal xray for 04/2021- negative.  ? ?Lab Tests:  ?I viewed & interpreted labs including:  ?CBC: mild leukopenia ?CMP: mildly elevated creatinine- improved from prior.  ?Lipase: WNL ?UA: no UTI, proteinuria present.  ? ?Imaging Studies:  ?I ordered and viewed the following imaging, agree with radiologist impression: 1. No acute intra-abdominal or pelvic pathology. 2. Moderate colonic stool burden. No bowel obstruction. Normal appendix.  ? ?Reassuring work-up in the emergency department.  CT abdomen/pelvis without acute pathology, repeat abdominal exam remains without peritoneal signs therefore I have low suspicion for acute surgical process such as perforation, obstruction, appendicitis, volvulus etc.  Patient able to tolerate p.o. in the ED.  He overall seems reasonable for discharge.  He would like alternative follow-up for GI, has  seen equal in the past, provided information for other local GI care.  Will trial MiraLAX given his moderate stool burden, Bentyl as needed for crampy pain, and Carafate prior to meals. I discussed results, treatment pl

## 2021-09-25 NOTE — ED Provider Triage Note (Signed)
Emergency Medicine Provider Triage Evaluation Note ? ?Eric Kent , a 41 y.o. male  was evaluated in triage.  Pt complains of abdominal pain and distention.  States that he has been having these issues for many months but has been worsening over the past 2 months.  Sometimes worsens after eating.  Reports the need to burp and sometimes vomit due to the distention.  Also reports intermittent diarrhea.  States that he has been seen by gastroenterology for this in the past. ? ?Physical Exam  ?BP 120/83 (BP Location: Left Arm)   Pulse 89   Temp 97.9 ?F (36.6 ?C) (Oral)   Resp 17   Ht 6' (1.829 m)   Wt 99.8 kg   SpO2 94%   BMI 29.84 kg/m?  ?Gen:   Awake, no distress   ?Resp:  Normal effort  ?MSK:   Moves extremities without difficulty  ?Other:   ? ?Medical Decision Making  ?Medically screening exam initiated at 7:37 PM.  Appropriate orders placed.  Eric Kent was informed that the remainder of the evaluation will be completed by another provider, this initial triage assessment does not replace that evaluation, and the importance of remaining in the ED until their evaluation is complete. ?  ?Eric Sou, PA-C ?09/25/21 1938 ? ?

## 2021-09-25 NOTE — ED Triage Notes (Signed)
Per EMS, patient from home, c/o LLQ distension x 1 year. Reports N/V/D x 1 year. ?

## 2021-09-26 ENCOUNTER — Emergency Department (HOSPITAL_COMMUNITY): Payer: Self-pay

## 2021-09-26 MED ORDER — POLYETHYLENE GLYCOL 3350 17 G PO PACK
17.0000 g | PACK | Freq: Every day | ORAL | 0 refills | Status: AC | PRN
Start: 2021-09-26 — End: ?

## 2021-09-26 MED ORDER — DICYCLOMINE HCL 20 MG PO TABS
20.0000 mg | ORAL_TABLET | Freq: Three times a day (TID) | ORAL | 0 refills | Status: AC | PRN
Start: 2021-09-26 — End: ?

## 2021-09-26 MED ORDER — SUCRALFATE 1 GM/10ML PO SUSP: 1.0000 g | Freq: Three times a day (TID) | ORAL | 0 refills | Status: AC

## 2021-09-26 MED ORDER — IOHEXOL 300 MG/ML  SOLN
100.0000 mL | Freq: Once | INTRAMUSCULAR | Status: AC | PRN
  Administered 2021-09-26: 100 mL via INTRAVENOUS

## 2021-09-26 NOTE — Discharge Instructions (Addendum)
You were seen in the emergency department today for abdominal pain.  Your blood work showed that your white blood cell count was mildly low, that your creatinine which looks at your kidney function was mildly elevated (this has been more elevated in the past however), and that you have some protein in your urine.  Please have each of these lab test rechecked by your primary care provider.  Your CT scan showed moderate stool present in your colon, did not show any significant acute pathology.  Please take Bentyl every 8 hours as needed for abdominal cramping/spasms, MiraLAX daily as needed for constipation, and Carafate prior to meals and prior to bedtime to help with stomach upset with eating. ? ? ?We have prescribed you new medication(s) today. Discuss the medications prescribed today with your pharmacist as they can have adverse effects and interactions with your other medicines including over the counter and prescribed medications. Seek medical evaluation if you start to experience new or abnormal symptoms after taking one of these medicines, seek care immediately if you start to experience difficulty breathing, feeling of your throat closing, facial swelling, or rash as these could be indications of a more serious allergic reaction ? ?Please follow-up with one of the local GI physician options in the area.  Return to the ER for new or worsening symptoms including but not limited to new or worsening pain, inability to keep fluids down, blood in your stool, fevers, or any other concerns. ? ?

## 2021-12-25 ENCOUNTER — Encounter (HOSPITAL_COMMUNITY): Payer: Self-pay | Admitting: *Deleted

## 2021-12-25 ENCOUNTER — Ambulatory Visit (HOSPITAL_COMMUNITY)
Admission: EM | Admit: 2021-12-25 | Discharge: 2021-12-25 | Disposition: A | Discharge status: Home / Self Care | Payer: Medicaid Other | Attending: Physician Assistant | Admitting: Physician Assistant

## 2021-12-25 ENCOUNTER — Other Ambulatory Visit: Payer: Self-pay

## 2021-12-25 DIAGNOSIS — J069 Acute upper respiratory infection, unspecified: Secondary | ICD-10-CM | POA: Insufficient documentation

## 2021-12-25 DIAGNOSIS — U071 COVID-19: Secondary | ICD-10-CM | POA: Insufficient documentation

## 2021-12-25 MED ORDER — NIRMATRELVIR/RITONAVIR (PAXLOVID)TABLET: 3.0000 | ORAL_TABLET | Freq: Two times a day (BID) | ORAL | 0 refills | Status: DC

## 2021-12-25 MED ORDER — NIRMATRELVIR/RITONAVIR (PAXLOVID)TABLET: 3.0000 | ORAL_TABLET | Freq: Two times a day (BID) | ORAL | 0 refills | Status: AC

## 2021-12-25 NOTE — ED Triage Notes (Signed)
Pt had a Positive home COVID test yesterday. Pt reports he has a cough and runny nose.

## 2021-12-25 NOTE — Discharge Instructions (Addendum)
Advised to take DayQuil or NyQuil to help control the congestion and cough. Advised take ibuprofen or Tylenol as needed for fever. Advised to take Paxlovid as directed until completed to treat COVID. Advised to follow-up with PCP or return to urgent care if symptoms fail to improve.

## 2021-12-25 NOTE — ED Provider Notes (Signed)
[facility]    CSN: [id number] Arrival date & time: 12/25/21  1002      History   Chief Complaint Chief Complaint  Patient presents with   Cough   Nasal Congestion    HPI Eric Kent is a 41 y.o. male.   41 year old male presents with cough and congestion.  Patient indicates that he has been in the household with is not for the past several days who has cough and congestion.  Patient indicates that he decided to do a COVID test last night at her request and it was positive.  Patient also had his aunt do a COVID test and it also was positive.  She indicates he has mild upper respiratory congestion with rhinitis postnasal drip which is clear, mild chest congestion and cough also with clear production.  Patient indicates he has not had any fever, chills, or body aches.  Patient does indicate that he has been vaccinated against COVID.  Patient relates he is not having any nausea, vomiting, or diarrhea.  He does indicate that he has some GI problems normally but they have not changed in the past couple days.  Patient indicates he is tolerating fluids well and eating normally.  Patient is without shortness of breath or wheezing.   Cough Associated symptoms: rhinorrhea     Past Medical History:  Diagnosis Date   GERD (gastroesophageal reflux disease)    Hypertension    Ulcer     There are no problems to display for this patient.   Past Surgical History:  Procedure Laterality Date   TONSILLECTOMY         Home Medications    Prior to Admission medications   Medication Sig Start Date End Date Taking? Authorizing Provider  clindamycin (CLEOCIN) 300 MG capsule Take 1 capsule (300 mg total) by mouth 3 (three) times daily. 12/07/19   Wallis Bamberg, PA-C  dicyclomine (BENTYL) 20 MG tablet Take 1 tablet (20 mg total) by mouth every 8 (eight) hours as needed for spasms (abdominal pain/cramping.). 09/26/21   Petrucelli, Samantha R, PA-C  naproxen (NAPROSYN) 500 MG tablet  Take 1 tablet (500 mg total) by mouth 2 (two) times daily. 09/09/20   Ivette Loyal, NP  nirmatrelvir/ritonavir EUA (PAXLOVID) 20 x 150 MG & 10 x 100MG  TABS Take 3 tablets by mouth 2 (two) times daily for 5 days. Patient GFR is >60. Take nirmatrelvir (150 mg) two tablets twice daily for 5 days and ritonavir (100 mg) one tablet twice daily for 5 days. 12/25/21 12/30/21  03/01/22, PA-C  NON FORMULARY Takes "hydro something" for blood pressure, does not take regularly.  Last pill was one week ago    [provider]  polyethylene glycol (MIRALAX) 17 g packet Take 17 g by mouth daily as needed for moderate constipation. 09/26/21   Petrucelli, Samantha R, PA-C  sucralfate (CARAFATE) 1 GM/10ML suspension Take 10 mLs (1 g total) by mouth 4 (four) times daily -  with meals and at bedtime. 09/26/21   Petrucelli, Samantha R, PA-C  tiZANidine (ZANAFLEX) 4 MG tablet Take 1 tablet (4 mg total) by mouth every 6 (six) hours as needed for muscle spasms. 09/09/20   09/11/20, NP    Family History History reviewed. No pertinent family history.  Social History Social History   Tobacco Use   Smoking status: Former    Types: Cigarettes    Quit date: 09/16/2011    Years since quitting: 10.2   Smokeless tobacco: Never  Vaping Use   Vaping Use: Never used  Substance Use Topics   Alcohol use: Not Currently    Comment: occ   Drug use: No     Allergies   Penicillins   Review of Systems Review of Systems  HENT:  Positive for postnasal drip and rhinorrhea.   Respiratory:  Positive for cough.      Physical Exam Triage Vital Signs ED Triage Vitals  Enc Vitals Group     BP 12/25/21 1016 111/82     Pulse Rate 12/25/21 1016 64     Resp 12/25/21 1016 18     Temp 12/25/21 1016 98.7 F (37.1 C)     Temp src --      SpO2 12/25/21 1016 96 %     Weight --      Height --      Head Circumference --      Peak Flow --      Pain Score 12/25/21 1015 0     Pain Loc --      Pain Edu? --       Excl. in GC? --    No data found.  Updated Vital Signs BP 111/82   Pulse 64   Temp 98.7 F (37.1 C)   Resp 18   SpO2 96%   Visual Acuity Right Eye Distance:   Left Eye Distance:   Bilateral Distance:    Right Eye Near:   Left Eye Near:    Bilateral Near:     Physical Exam Constitutional:      Appearance: Normal appearance.  HENT:     Right Ear: Tympanic membrane and ear canal normal.     Left Ear: Tympanic membrane and ear canal normal.     Mouth/Throat:     Mouth: Mucous membranes are moist.     Pharynx: Oropharynx is clear. No posterior oropharyngeal erythema.  Cardiovascular:     Rate and Rhythm: Normal rate and regular rhythm.     Heart sounds: Normal heart sounds.  Pulmonary:     Effort: Pulmonary effort is normal.     Breath sounds: Normal breath sounds and air entry. No wheezing, rhonchi or rales.  Lymphadenopathy:     Cervical: No cervical adenopathy.  Neurological:     Mental Status: He is alert.      UC Treatments / Results  Labs (all labs ordered are listed, but only abnormal results are displayed) Labs Reviewed  SARS CORONAVIRUS 2 (TAT 6-24 HRS)    EKG   Radiology No results found.  Procedures Procedures (including critical care time)  Medications Ordered in UC Medications - No data to display  Initial Impression / Assessment and Plan / UC Course  I have reviewed the triage vital signs and the nursing notes.  Pertinent labs & imaging results that were available during my care of the patient were reviewed by me and considered in my medical decision making (see chart for details).    Plan: 1.  COVID test is pending. 2.  Advised take the Paxlovid to treat COVID. 3.  Advised take ibuprofen or Motrin to help treat fever if needed, Advil Cold and Sinus to help treat cough and congestion. 4.  Advised to follow-up with PCP or return to urgent care if symptoms fail to improve Final Clinical Impressions(s) / UC Diagnoses   Final diagnoses:   Viral upper respiratory tract infection  COVID     Discharge Instructions      Advised to take DayQuil  or NyQuil to help control the congestion and cough. Advised take ibuprofen or Tylenol as needed for fever. Advised to take Paxlovid as directed until completed to treat COVID. Advised to follow-up with PCP or return to urgent care if symptoms fail to improve.     ED Prescriptions     Medication Sig Dispense Auth. Provider   nirmatrelvir/ritonavir EUA (PAXLOVID) 20 x 150 MG & 10 x 100MG  TABS  (Status: Discontinued) Take 3 tablets by mouth 2 (two) times daily for 5 days. Patient GFR is >60. Take nirmatrelvir (150 mg) two tablets twice daily for 5 days and ritonavir (100 mg) one tablet twice daily for 5 days. 30 tablet , PA-C   nirmatrelvir/ritonavir EUA (PAXLOVID) 20 x 150 MG & 10 x 100MG  TABS Take 3 tablets by mouth 2 (two) times daily for 5 days. Patient GFR is >60. Take nirmatrelvir (150 mg) two tablets twice daily for 5 days and ritonavir (100 mg) one tablet twice daily for 5 days. 30 tablet Ellsworth Lennox, PA-C      PDMP not reviewed this encounter.   , PA-C 12/25/21 1050

## 2021-12-26 LAB — SARS CORONAVIRUS 2 (TAT 6-24 HRS): SARS Coronavirus 2: POSITIVE — AB

## 2023-08-10 ENCOUNTER — Encounter (HOSPITAL_COMMUNITY): Payer: Self-pay | Admitting: *Deleted

## 2023-08-10 ENCOUNTER — Emergency Department (HOSPITAL_COMMUNITY)
Admission: EM | Admit: 2023-08-10 | Discharge: 2023-08-10 | Disposition: A | Discharge status: Home / Self Care | Payer: Self-pay | Attending: Emergency Medicine | Admitting: Emergency Medicine

## 2023-08-10 ENCOUNTER — Other Ambulatory Visit: Payer: Self-pay

## 2023-08-10 DIAGNOSIS — M79605 Pain in left leg: Secondary | ICD-10-CM | POA: Diagnosis not present

## 2023-08-10 DIAGNOSIS — M25512 Pain in left shoulder: Secondary | ICD-10-CM | POA: Diagnosis present

## 2023-08-10 DIAGNOSIS — Y92410 Unspecified street and highway as the place of occurrence of the external cause: Secondary | ICD-10-CM | POA: Insufficient documentation

## 2023-08-10 DIAGNOSIS — S46012A Strain of muscle(s) and tendon(s) of the rotator cuff of left shoulder, initial encounter: Secondary | ICD-10-CM | POA: Insufficient documentation

## 2023-08-10 DIAGNOSIS — R030 Elevated blood-pressure reading, without diagnosis of hypertension: Secondary | ICD-10-CM | POA: Diagnosis not present

## 2023-08-10 DIAGNOSIS — S46912A Strain of unspecified muscle, fascia and tendon at shoulder and upper arm level, left arm, initial encounter: Secondary | ICD-10-CM

## 2023-08-10 MED ORDER — METHOCARBAMOL 750 MG PO TABS: 750.0000 mg | ORAL_TABLET | Freq: Three times a day (TID) | ORAL | 0 refills | Status: AC | PRN

## 2023-08-10 MED ORDER — IBUPROFEN 400 MG PO TABS
400.0000 mg | ORAL_TABLET | Freq: Once | ORAL | Status: AC
  Administered 2023-08-10: 400 mg via ORAL
  Filled 2023-08-10: qty 1

## 2023-08-10 MED ORDER — ACETAMINOPHEN 500 MG PO TABS
1000.0000 mg | ORAL_TABLET | Freq: Once | ORAL | Status: AC
  Administered 2023-08-10: 1000 mg via ORAL
  Filled 2023-08-10: qty 2
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
## 2023-08-10 NOTE — Discharge Instructions (Signed)
 It was our pleasure to provide your ER care today - we hope that you feel better.  Take acetaminophen or ibuprofen as need for pain. You may also take robaxin as need for muscle pain/spasm - no driving when taking.   Follow up with primary care doctor in 1-2 weeks if symptoms fail to improve/resolve. Also follow up with primary care doctor in the next couple weeks regarding your blood pressure that is high today.   Return to ER if worse, new symptoms, new/severe pain, trouble breathing, or other emergency concern.
